# Patient Record
Sex: Female | Born: 1965 | Race: White | Hispanic: No | Marital: Single | Smoking: Never smoker
Health system: Southern US, Community
[De-identification: ages and names within clinical notes are randomized; demographics above are authoritative.]

## PROBLEM LIST (undated history)

## (undated) DIAGNOSIS — E559 Vitamin D deficiency, unspecified: Secondary | ICD-10-CM

## (undated) DIAGNOSIS — J40 Bronchitis, not specified as acute or chronic: Secondary | ICD-10-CM

## (undated) DIAGNOSIS — F32A Depression, unspecified: Secondary | ICD-10-CM

## (undated) DIAGNOSIS — R41840 Attention and concentration deficit: Secondary | ICD-10-CM

## (undated) DIAGNOSIS — I10 Essential (primary) hypertension: Secondary | ICD-10-CM

## (undated) DIAGNOSIS — F419 Anxiety disorder, unspecified: Secondary | ICD-10-CM

## (undated) DIAGNOSIS — F329 Major depressive disorder, single episode, unspecified: Secondary | ICD-10-CM

## (undated) DIAGNOSIS — F3281 Premenstrual dysphoric disorder: Secondary | ICD-10-CM

## (undated) DIAGNOSIS — E669 Obesity, unspecified: Secondary | ICD-10-CM

## (undated) DIAGNOSIS — M21619 Bunion of unspecified foot: Secondary | ICD-10-CM

## (undated) HISTORY — DX: Essential (primary) hypertension: I10

## (undated) HISTORY — DX: Attention and concentration deficit: R41.840

## (undated) HISTORY — PX: OTHER SURGICAL HISTORY: SHX169

## (undated) HISTORY — DX: Vitamin D deficiency, unspecified: E55.9

## (undated) HISTORY — DX: Premenstrual dysphoric disorder: F32.81

## (undated) HISTORY — DX: Obesity, unspecified: E66.9

## (undated) HISTORY — DX: Bunion of unspecified foot: M21.619

## (undated) HISTORY — DX: Depression, unspecified: F32.A

## (undated) HISTORY — DX: Major depressive disorder, single episode, unspecified: F32.9

## (undated) HISTORY — DX: Anxiety disorder, unspecified: F41.9

## (undated) HISTORY — PX: TONSILLECTOMY AND ADENOIDECTOMY: SUR1326

## (undated) HISTORY — PX: BUNIONECTOMY: SHX129

## (undated) HISTORY — DX: Bronchitis, not specified as acute or chronic: J40

---

## 2002-09-27 HISTORY — PX: COLONOSCOPY: SHX174

## 2011-02-15 LAB — HM MAMMOGRAPHY

## 2012-12-06 DIAGNOSIS — E669 Obesity, unspecified: Secondary | ICD-10-CM | POA: Insufficient documentation

## 2012-12-06 DIAGNOSIS — R41840 Attention and concentration deficit: Secondary | ICD-10-CM | POA: Insufficient documentation

## 2012-12-06 DIAGNOSIS — F32A Depression, unspecified: Secondary | ICD-10-CM | POA: Insufficient documentation

## 2012-12-06 DIAGNOSIS — F329 Major depressive disorder, single episode, unspecified: Secondary | ICD-10-CM | POA: Insufficient documentation

## 2012-12-06 DIAGNOSIS — M21619 Bunion of unspecified foot: Secondary | ICD-10-CM | POA: Insufficient documentation

## 2012-12-06 DIAGNOSIS — E559 Vitamin D deficiency, unspecified: Secondary | ICD-10-CM | POA: Insufficient documentation

## 2012-12-12 ENCOUNTER — Ambulatory Visit (INDEPENDENT_AMBULATORY_CARE_PROVIDER_SITE_OTHER): Payer: Self-pay | Admitting: Family Medicine

## 2012-12-12 ENCOUNTER — Encounter: Payer: Self-pay | Admitting: Family Medicine

## 2012-12-12 VITALS — BP 119/88 | HR 75 | Resp 16 | Wt 221.0 lb

## 2012-12-12 DIAGNOSIS — N943 Premenstrual tension syndrome: Secondary | ICD-10-CM

## 2012-12-12 DIAGNOSIS — R41840 Attention and concentration deficit: Secondary | ICD-10-CM

## 2012-12-12 MED ORDER — VILAZODONE HCL 10 & 20 & 40 MG PO KIT: 10.0000 mg | PACK | Freq: Every day | ORAL | Status: DC

## 2012-12-12 MED ORDER — LISDEXAMFETAMINE DIMESYLATE 70 MG PO CAPS: 70.0000 mg | ORAL_CAPSULE | Freq: Every day | ORAL | Status: DC

## 2012-12-12 NOTE — Progress Notes (Signed)
  Subjective:    Patient ID: Janet Glover, female    DOB: Nov 07, 1965, 47 y.o.   MRN: [id number]  HPI:  Janet Glover is here to get a refill for her Vyvanse.  She also wants to discuss her mood/PMS. She has been taking Prometrium for the last 1/2 of each month 2 weeks prior to her period.  She feels that it has helped but she thinks that she needs additional help.  She says that people get on her nerves. She tried the Belviq for a couple of weeks.  We thought we'd try it vs Prozac vs Viibryd.  She says that the Belviq worked well for her mood and appetite suppression but then it stopped working.      Review of Systems  Cardiovascular: Negative for palpitations.  Psychiatric/Behavioral: Positive for decreased concentration.       She gets annoyed with others easily and feels that this is worse around her period.         Objective:   Physical Exam  Constitutional: Vital signs are normal.  Cardiovascular: Normal rate and regular rhythm.   Pulmonary/Chest: Effort normal.  Psychiatric: She has a normal mood and affect. Her speech is normal and behavior is normal. Judgment and thought content normal. Cognition and memory are normal.          Assessment & Plan:   1)  ADD:  She will continue on her current dosage of Vyvanse.  She is not going to go several days without it like she has been doing so.  She is to take at least 1/3 of the dosage daily so she does not have a big drop in her dosage.    2)  PMS:  She is going to try some 5 HTP first to see how this works for her.  If she needs additional help, she will add 10 - 20 mg of the Viibryd.

## 2012-12-12 NOTE — Patient Instructions (Addendum)
1)  Mood:  Start on the highest dosage of OTC 5HTP you can find.  If you need additional help with your mood then add the Viibryd 10 mg and increase to 20 mg if needed.    2)  Concentration:  Try to take at least 1/3 of your Vyvanse dosage daily.  3)  Weight:  Start back on [employer] and get back to the gym.    Premenstrual Syndrome Premenstrual syndrome (PMS) or premenstrual disphoric disorder (PMDD) is a mix of emotional and clinical symptoms. PMS occurs 10 to 14 days before the start of a menstrual period. Common symptoms include pelvic pain, headache and mood changes. Most women have PMS to some degree.  CAUSES  The cause is unknown. There is evidence that it is related to the female hormones during the second half of the menstrual cycle. These hormones fluctuate and are thought to affect chemicals in the brain (serotonin) that can influence a person's mood. SYMPTOMS  Symptoms may include any of the following:  Headache.  Swelling of hands and feet.  Abdominal bloating.  Tiredness.  Breast tenderness.  Depression.  Crying spells.  Anxiety.  Irritability.  Confusion.  Joint and muscle pains.  Forgetfulness.  Withdrawal from family, friends and activities. DIAGNOSIS  Diagnosis is made by your caregiver who will ask you questions about the kind of symptoms you are having, when they occur and what may bring them on. If your are having any of the symptoms listed above that occur 10 to 14 days before your menstrual period, it is strong evidence you have PMS. TREATMENT   Only take over-the-counter or prescription medicines for pain, discomfort or fever as directed by your caregiver.  Oral contraceptives.  Hormone therapy.  Medications that slow down the production of serotonin in the brain (fluoxetine, sertraline and others).  Diuretics. These get rid of extra fluid from your body.  Anti-depression medication when necessary.  Surgery to remove both ovaries.  This is a last resort and if no further pregnancies are wanted.  Consider counseling or joining a PMS therapy support group. HOME CARE INSTRUCTIONS   Exercise regularly as suggested by your caregiver. Exercise especially before your menstrual period.  Eat a regular, well-balanced diet rich in carbohydrates.  Restrict or eliminate caffeine, alcohol and tobacco consumption.  Be sure to get enough sleep. Practice relaxation techniques.  Drink 64 oz. fluids per day. This is 8 glasses, 8 oz. each. It is best to drink water.  Eliminate known stressors in your life.  Attend relationship or parenting counseling, if needed.  Take a multi-vitamin in the recommended daily dosages.  Calcium, magnesium, vitamin B6 and vitamin E are some times helpful for PMS symptoms.  Take medications as suggested by your caregiver. SEEK MEDICAL CARE IF:   You need medication for excessive swelling, depression, severe headaches, or because you cannot sleep.  You need help from your caregiver to help you decide if you need to have your ovaries removed because none of your treatment is helping you. Document Released: 09/10/2000 Document Revised: 12/06/2011 Document Reviewed: 01/31/2012 [facility] Patient Information 2013 [place], [place].

## 2012-12-13 ENCOUNTER — Encounter: Payer: Self-pay | Admitting: Family Medicine

## 2013-03-09 ENCOUNTER — Ambulatory Visit (INDEPENDENT_AMBULATORY_CARE_PROVIDER_SITE_OTHER): Payer: 59 | Admitting: Family Medicine

## 2013-03-09 ENCOUNTER — Encounter: Payer: Self-pay | Admitting: Family Medicine

## 2013-03-09 VITALS — BP 118/78 | HR 83 | Wt 226.0 lb

## 2013-03-09 DIAGNOSIS — R41840 Attention and concentration deficit: Secondary | ICD-10-CM

## 2013-03-09 DIAGNOSIS — I10 Essential (primary) hypertension: Secondary | ICD-10-CM

## 2013-03-09 DIAGNOSIS — N943 Premenstrual tension syndrome: Secondary | ICD-10-CM

## 2013-03-09 MED ORDER — LISDEXAMFETAMINE DIMESYLATE 70 MG PO CAPS: 70.0000 mg | ORAL_CAPSULE | Freq: Every day | ORAL | Status: DC

## 2013-03-09 MED ORDER — TRIAMTERENE-HCTZ 37.5-25 MG PO TABS: 1.0000 | ORAL_TABLET | Freq: Every day | ORAL | Status: DC

## 2013-03-09 MED ORDER — VILAZODONE HCL 40 MG PO TABS: 40.0000 mg | ORAL_TABLET | Freq: Every day | ORAL | Status: DC

## 2013-03-09 NOTE — Progress Notes (Signed)
  Subjective:    Patient ID: Janet Glover, female    DOB: August 08, 1966, 47 y.o.   MRN: [id number]  HPI  Janet Glover is here today to discuss a few issues:  1)  ADD:  She needs a refill on her Vyvanse. She is doing fine on her current dosage.  She is eating and sleeping fine.    2)  Mood:  Her mood has improved on her Viibryd.  She notices the biggest difference around her period.    She needs a refill on it.   3)  HTN:  She needs to have Maxzide refilled.     Review of Systems  Constitutional: Negative for activity change, fatigue and unexpected weight change.  HENT: Negative.   Eyes: Negative.   Respiratory: Negative for shortness of breath.   Cardiovascular: Negative for chest pain, palpitations and leg swelling.  Gastrointestinal: Negative for diarrhea and constipation.  Endocrine: Negative.   Genitourinary: Negative for difficulty urinating.  Musculoskeletal: Negative.   Skin: Negative.   Neurological: Negative.   Hematological: Negative for adenopathy. Does not bruise/bleed easily.  Psychiatric/Behavioral: Negative for sleep disturbance and dysphoric mood. The patient is not nervous/anxious.        Past Medical History  Diagnosis Date  . Depression   . Obesity   . Vitamin D deficiency   . Premenstrual tension syndrome   . Attention and concentration deficit   . Bunion   . Bronchitis    Family History  Problem Relation Age of Onset  . Hypertension Mother   . Hyperlipidemia Mother   . Depression Mother   . Hypertension Father   . Depression Sister   . Vascular Disease Other   . Colon cancer Other   . CVA Other   . Heart disease Maternal Grandmother   . 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 No distress.  HENT:  Head: Normocephalic.  Eyes: No scleral icterus.  Neck: No thyromegaly present.  Cardiovascular: Normal rate, regular rhythm and normal heart sounds.   Pulmonary/Chest: Effort normal and breath sounds normal.  Abdominal: There is no tenderness.  Musculoskeletal: She exhibits no edema and no tenderness.  Neurological: She is alert.  Skin: Skin is warm and dry.  Psychiatric: She has a normal mood and affect. Her behavior is normal. Judgment and thought content normal.          Assessment & Plan:

## 2013-03-09 NOTE — Patient Instructions (Addendum)
Diet Following Bariatric Surgery The bariatric diet is designed to provide fluids and nourishment while promoting weight loss after bariatric surgery. The diet is divided into 3 stages. The rate of progression varies based on individual food tolerance. DIET FOLLOWING BARIATRIC SURGERY The diet following surgery is divided into 3 stages to allow a gradual adjustment. It is very important to the success of your surgery to:  Progress to each stage slowly.  Eat at set times.  Chew food well and stop eating when you are full.  Not drink liquids 30 minutes before and after meals. If you feel tightness or pressure in your chest, that means you are full. Wait 30 minutes before you try to eat again. STAGE 1 BARIATRIC DIET - ABOUT 2 WEEKS IN DURATION   The diet begins the day of surgery. It will last about 1 to 2 weeks after surgery. Your surgeon may have individual guidelines for you about specific foods or the progression of your diet. Follow your surgeon's guidelines.  If clear liquids are well-tolerated without vomiting, your caregiver will add a 4 oz to 6 oz high protein, low-calorie liquid supplement. You could add this to your meal plan 3 times daily. You will need at least 60 g to 80 g of protein daily or as determined by your Registered Dietitian.  Guidelines for choosing a protein supplement include:  At least 15 g of protein per 8 oz serving.  Less than 20 g total carbohydrate per 8 oz serving.  Less than 5 g fat per 8 oz serving.  Avoid carbonated beverages, caffeine, alcohol, and concentrated sweets such as sugar, cakes, and cookies.  Right after surgery, you may only be able to eat 3 to 4 tsp per meal. Your maximum volume should not exceed  to  cup total. Do not eat or drink more than 1 oz or 2 tbs every 15 minutes.  Take a chewable multivitamin and mineral supplement.  Drink at least 48 oz of fluid daily, which includes your protein supplement. Food and beverages from the  list below are allowed at set times (for example at 8 AM, 12 noon, or 5 PM):  Decaffeinated coffee or tea.  100% fruit juice.  Diet or sugar-free drinks.  Broth.  Blenderized soup.  Skim milk or lactose-free milk.  Sugar-free gelatin dessert or frozen ice pops.  Mashed potatoes.  Yogurt (artificially sweetened).  Sugar-free pudding.  Blended low-fat cottage cheese.  Unsweetened applesauce, grits, or hot wheat cereal. Four to six ounces of a liquid protein supplement from the list below is recommended for snacks at 10 AM, 2 PM, and 8 PM.  STAGE 2 BARIATRIC DIET (SOFT DIET) - ABOUT 4 WEEKS IN DURATION  About 2 weeks after surgery, your caregiver will progress your diet to this stage. Foods may need to be blended to the consistency of applesauce. Choose low-fat foods (less than 5 g of fat per serving) and avoid concentrated sweets and sugar (less than 10 g of sugar per serving). Meals should not exceed  to  cup total. This stage will last about 4 weeks. It is recommended that you meet with your dietitian at this stage to begin preparation for the last stage. This stage consists of 3 meals a day with a liquid protein supplement between meals twice daily. Do not drink liquids with foods. You must wait 30 minutes for the stomach pouch to empty before drinking. Chew food well. The food must be almost liquified before swallowing. Soft foods from the   list below can now be slowly added to your diet:  Soft fruit (soft canned fruit in light syrup or natural juice, banana, melon, peaches, pears, or strawberries).  Cooked vegetables.  Toast or crackers (becomes soft after chewing 20 times).  Hot wheat cereal.  Fish.  Eggs (scrambled, soft-boiled). STAGE 3 BARIATRIC DIET (REGULAR DIET) - ABOUT 6 to 8 WEEKS AFTER SURGERY About 6 to 8 weeks after surgery, you will be advanced to food that is regular in texture. This diet should include all food groups. The diet will continue to promote  weight loss. Meals should not exceed  to 1 cup total. Your dietitian will be available to assist you in meal planning and additional behavioral strategies to make this final stage a long-term success. Slowly add foods of regular consistency and remember:  Eat only at your chosen meal times.  Minimize drinking with meals. You should drink 30 minutes before eating. Do not start drinking again for about 2 hours after eating.  Chew food well. Take small bites.  Think about the portion size of a healthy frozen meal. You will be able to eat most of this.  Make sure your meal is balanced with starch, protein, fruits, and vegetables.  When you feel full, stop eating. Document Released: 03/20/2003 Document Revised: 12/06/2011 Document Reviewed: 12/11/2010 [facility] Patient Information 2014 [place], [place].  Clean Diet Dalia Heading Diet

## 2013-04-08 DIAGNOSIS — I10 Essential (primary) hypertension: Secondary | ICD-10-CM | POA: Insufficient documentation

## 2013-04-08 NOTE — Assessment & Plan Note (Signed)
Refilled her Vyvanse 

## 2013-04-08 NOTE — Assessment & Plan Note (Signed)
Refilled her Maxzide.  

## 2013-04-08 NOTE — Assessment & Plan Note (Signed)
Refilled her Viibryd.

## 2013-06-15 ENCOUNTER — Ambulatory Visit (INDEPENDENT_AMBULATORY_CARE_PROVIDER_SITE_OTHER): Payer: 59 | Admitting: Family Medicine

## 2013-06-15 ENCOUNTER — Encounter: Payer: Self-pay | Admitting: Family Medicine

## 2013-06-15 VITALS — BP 117/81 | HR 77 | Resp 16 | Ht 65.0 in | Wt 214.0 lb

## 2013-06-15 DIAGNOSIS — F909 Attention-deficit hyperactivity disorder, unspecified type: Secondary | ICD-10-CM

## 2013-06-15 MED ORDER — LISDEXAMFETAMINE DIMESYLATE 70 MG PO CAPS: 70.0000 mg | ORAL_CAPSULE | Freq: Every day | ORAL | Status: DC

## 2013-06-15 NOTE — Progress Notes (Signed)
  Subjective:    Patient ID: Janet Glover, female    DOB: 04-Mar-1966, 47 y.o.   MRN: [id number]  HPI  Janet Glover is here today to get a refill on her ADD medication (Vyvanse 70 mg).  She says that the medication continues to help her concentrate at work.  She feels that this dosage is appropriate and would like to continue on it.     Review of Systems  Constitutional: Negative.   HENT: Negative.   Eyes: Negative.   Respiratory: Negative.   Cardiovascular: Negative.   Gastrointestinal: Negative.   Endocrine: Negative.   Genitourinary: Negative.   Musculoskeletal: Negative.   Skin: Negative.   Allergic/Immunologic: Negative.   Neurological: Negative.   Hematological: Negative.   Psychiatric/Behavioral: Negative.      Past Medical History  Diagnosis Date  . Depression   . Obesity   . Vitamin D deficiency   . Premenstrual tension syndrome   . Attention and concentration deficit   . Bunion   . Bronchitis      Family History  Problem Relation Age of Onset  . Hypertension Mother   . Hyperlipidemia Mother   . Depression Mother   . Hypertension Father   . Depression Sister   . Vascular Disease Other   . Colon cancer Other   . CVA Other   . Heart disease Maternal Grandmother   . 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 Constitutional: She is oriented to person, place, and time. She appears well-developed and well-nourished.  Cardiovascular: Normal rate and regular rhythm.   Pulmonary/Chest: Effort normal and breath sounds normal.   Neurological: She is alert and oriented to person, place, and time.  Skin: Skin is warm and dry.  Psychiatric: She has a normal mood and affect.      Assessment & Plan:   Janet Glover was seen today for adhd.  Diagnoses and associated orders for this visit:  Adult ADHD Comments: Refilled her Vyvanse for 3 months - lisdexamfetamine (VYVANSE) 70 MG capsule; Take 1 capsule (70 mg total) by mouth daily.

## 2013-09-01 DIAGNOSIS — F909 Attention-deficit hyperactivity disorder, unspecified type: Secondary | ICD-10-CM | POA: Insufficient documentation

## 2013-09-11 ENCOUNTER — Encounter (INDEPENDENT_AMBULATORY_CARE_PROVIDER_SITE_OTHER): Payer: Self-pay

## 2013-09-11 ENCOUNTER — Encounter: Payer: Self-pay | Admitting: Family Medicine

## 2013-09-11 ENCOUNTER — Ambulatory Visit (INDEPENDENT_AMBULATORY_CARE_PROVIDER_SITE_OTHER): Payer: 59 | Admitting: Family Medicine

## 2013-09-11 VITALS — BP 119/84 | HR 76 | Resp 16 | Ht 64.37 in | Wt 197.0 lb

## 2013-09-11 DIAGNOSIS — E669 Obesity, unspecified: Secondary | ICD-10-CM | POA: Insufficient documentation

## 2013-09-11 DIAGNOSIS — N943 Premenstrual tension syndrome: Secondary | ICD-10-CM

## 2013-09-11 DIAGNOSIS — M25569 Pain in unspecified knee: Secondary | ICD-10-CM

## 2013-09-11 DIAGNOSIS — F909 Attention-deficit hyperactivity disorder, unspecified type: Secondary | ICD-10-CM

## 2013-09-11 DIAGNOSIS — F3281 Premenstrual dysphoric disorder: Secondary | ICD-10-CM

## 2013-09-11 MED ORDER — LISDEXAMFETAMINE DIMESYLATE 70 MG PO CAPS: 70.0000 mg | ORAL_CAPSULE | Freq: Every day | ORAL | Status: DC

## 2013-09-11 MED ORDER — PROGESTERONE MICRONIZED 200 MG PO CAPS: 200.0000 mg | ORAL_CAPSULE | Freq: Every day | ORAL | Status: DC

## 2013-09-11 MED ORDER — DICLOFENAC SODIUM 1 % TD GEL: 4.0000 g | Freq: Four times a day (QID) | TRANSDERMAL | Status: DC

## 2013-09-11 MED ORDER — VILAZODONE HCL 40 MG PO TABS: 40.0000 mg | ORAL_TABLET | Freq: Every day | ORAL | Status: DC

## 2013-09-11 NOTE — Progress Notes (Signed)
Subjective:    Patient ID: Janet Glover, female    DOB: 1966/09/27, 47 y.o.   MRN: [id number]  HPI  [name] is here today to get a refill on her ADD medication (Vyvanse 70 mg).  She says that the medication continues to help her concentrate at work.  She feels that this dosage is appropriate and would like to continue on it.  She also needs her FMLA forms completed.     Review of Systems  Constitutional: Negative for unexpected weight change.       She continues working out and dieting.   Musculoskeletal:       Pain in lower leg  Psychiatric/Behavioral: Negative for sleep disturbance and decreased concentration.  All other systems reviewed and are negative.     Past Medical History  Diagnosis Date  . Depression   . Obesity   . Vitamin D deficiency   . Attention and concentration deficit   . Bunion   . Bronchitis   . Anxiety   . PMDD (premenstrual dysphoric disorder)      Past Surgical History  Procedure Laterality Date  . Tonsillectomy and adenoidectomy    . 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 Hypertension Mother   . Hyperlipidemia Mother   . Depression Mother   . Hypertension Father   . Depression Sister   . Vascular Disease Other   . Colon cancer Other   . CVA Other   . Heart disease Maternal Grandmother   . Cancer Maternal Grandmother     colon cancer     Current Outpatient Prescriptions on File Prior to Visit  Medication Sig Dispense Refill  . triamterene-hydrochlorothiazide (MAXZIDE-25) 37.5-25 MG per tablet Take 1 tablet by mouth daily.  90  tablet  3   No current facility-administered medications on file prior to visit.     Allergies  Allergen Reactions  . Ace Inhibitors      Immunization History  Administered Date(s) Administered  . Td 09/27/2002  . Tdap 12/14/2006        Objective:   Physical Exam  Constitutional: She appears well-nourished. No distress.  Cardiovascular: Normal rate, regular rhythm and normal heart sounds.   Pulmonary/Chest: Effort normal and breath sounds normal.  Neurological: She is alert.  Psychiatric: She has a normal mood and affect. Her behavior is normal. Judgment and thought content normal.      Assessment & Plan:    Ravenne was seen today for medication management.  Diagnoses and associated orders for this visit:  Pain in joint, lower leg, unspecified laterality - diclofenac sodium (VOLTAREN) 1 % GEL; Apply 4 g topically 4 (four) times daily.  Adult ADHD Comments: Refilled her Vyvanse for 3 months - lisdexamfetamine (VYVANSE) 70 MG capsule; Take 1 capsule (70 mg total) by mouth daily.  PMDD (premenstrual dysphoric disorder) - Vilazodone HCl (VIIBRYD) 40 MG TABS; Take 1  tablet (40 mg total) by mouth daily. - progesterone (PROMETRIUM) 200 MG capsule; Take 1 capsule (200 mg total) by mouth at bedtime.    TIME SPENT "FACE TO FACE" WITH PATIENT -  30 MINS

## 2013-12-06 ENCOUNTER — Encounter (INDEPENDENT_AMBULATORY_CARE_PROVIDER_SITE_OTHER): Payer: Self-pay

## 2013-12-06 ENCOUNTER — Encounter: Payer: Self-pay | Admitting: Family Medicine

## 2013-12-06 ENCOUNTER — Ambulatory Visit (INDEPENDENT_AMBULATORY_CARE_PROVIDER_SITE_OTHER): Payer: 59 | Admitting: Family Medicine

## 2013-12-06 VITALS — BP 121/78 | HR 80 | Resp 16 | Ht 65.0 in | Wt 186.2 lb

## 2013-12-06 DIAGNOSIS — E669 Obesity, unspecified: Secondary | ICD-10-CM

## 2013-12-06 DIAGNOSIS — F909 Attention-deficit hyperactivity disorder, unspecified type: Secondary | ICD-10-CM

## 2013-12-06 MED ORDER — LISDEXAMFETAMINE DIMESYLATE 70 MG PO CAPS: 70.0000 mg | ORAL_CAPSULE | Freq: Every day | ORAL | Status: DC

## 2013-12-06 NOTE — Patient Instructions (Signed)
1)  ADD - Continue on Vyvanse.     2)  Weight - Congratulations!!  You have done great. Keep up the great work!!   3)  GI Symptoms - Lemon Ginger Tea with Probiotics      Gastrointestinal Problems During Exercise  Stomach problems that occur during exercise are common among athletes. This is especially true for distance runners and triathletes.  SYMPTOMS   Heartburn.  Feeling sick to stomach (nausea).  Vomiting.  Diarrhea.  Bloating.  Stomach pain.  Rectal bleeding and gassiness (flatulence).  Cramping.  Urge to go the bathroom (defecate). TREATMENT OF UPPER GI PROBLEMS The treatment of problems (symptoms) from the upper GI (gastrointestinal) tract caused by exercise are helped by:  Avoiding solid foods for 3 to 4 hours before intense exercise.  Eating a diet high in carbohydrates and very low in protein and fat.  Medications from your caregiver are also available to help upper GI problems. Some of these include antacids to neutralize the acids in your stomach. GASTROINTESTINAL BLEEDING Bleeding from the GI tract may occur with intense exercise. It is especially common in marathon runners. The bleeding is thought to come mostly from the stomach. It may also come from the large bowel (colon). You should not assume the bleeding is caused by exercise. If you have any problems with gastrointestinal bleeding, it is extremely important to see your caregiver for evaluation to make sure there are not other causes of bleeding. TREATMENT OF BLEEDING  Once it is known that exercise is the cause of blood loss, maintaining adequate fluid intake to avoid dehydration may be helpful.  Wear good running shoes to cut down on jarring the body during workouts.  It is good to have your blood (hemoglobin) and the iron stores of your body checked periodically, to see if iron replacement may be necessary.  Take medications prescribed by your caregiver as directed. Abdominal pain in athletes  is common during intense activities. The cause is unknown. It may be due to spasms in the muscular division between the abdomen and chest (the diaphragm), or by air trapped in parts of the large bowel. Abdominal pain may be helped by avoiding large meals prior to exercise.  Runner's diarrhea is a very common complaint among runners. The diarrhea seems to be related to the intensity of the exercise. It is more common during competition than during regular exercise. There are many different possible causes of this and it may be a combination of all of them. Treatment of diarrhea may be difficult. A reduction in fiber intake a day and a half before competition may be helpful. If the problem persists, antidiarrheal medicine may be used.  GENERALIZATIONS ABOUT TREATING ABDOMINAL EXERCISE PROBLEMS  Limit dietary fiber prior to competition.  Avoid solid foods at least three hours prior to a race.  Eat a pre-meal diet which is high in carbohydrates.  Avoid fat and protein intake during endurance events.  Drink fluids frequently during competition taking in two to three, 8 ounce glasses of fluid per hour. These should replace sodium, potassium, and carbohydrate in dilute solutions. Concentrated drinks may cause GI problems and aggravate existing conditions. Document Released: 06/08/2001 Document Revised: 12/06/2011 Document Reviewed: 09/13/2005 [facility] Patient Information 2014 [place].

## 2013-12-06 NOTE — Assessment & Plan Note (Signed)
Refilled her Vyvanse for 3 months.

## 2013-12-06 NOTE — Progress Notes (Signed)
Subjective:    Patient ID: Janet Glover, female    DOB: 1966/07/08, 47 y.o.   MRN: [id number]  HPI  Janet Glover is here today to get a refill on her ADD medication (Vyvanse 70 mg).  She says that the medication continues to help her concentrate at work.  She feels that this dosage is appropriate and would like to continue on it.     Review of Systems  Constitutional: Negative for appetite change and unexpected weight change.  Psychiatric/Behavioral: Negative for sleep disturbance and decreased concentration.  All other systems reviewed and are negative.     Past Medical History  Diagnosis Date  . Depression   . Obesity   . Vitamin D deficiency   . Attention and concentration deficit   . Bunion   . Bronchitis   . Anxiety   . PMDD (premenstrual dysphoric disorder)      Past Surgical History  Procedure Laterality Date  . Tonsillectomy and adenoidectomy    . 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 Hypertension Mother   . Hyperlipidemia Mother   . Depression Mother   . Hypertension Father   . Depression Sister   . Vascular Disease Other   . Colon cancer Other   . CVA Other   . Heart disease Maternal Grandmother   . Cancer Maternal Grandmother     colon cancer     Current Outpatient Prescriptions on File Prior to Visit  Medication Sig Dispense Refill  . Vilazodone HCl (VIIBRYD) 40 MG TABS Take 1 tablet (40 mg total) by mouth daily.  30 tablet  1  . triamterene-hydrochlorothiazide (MAXZIDE-25) 37.5-25 MG per tablet Take 1 tablet by mouth daily.  90 tablet  3    No current facility-administered medications on file prior to visit.     Allergies  Allergen Reactions  . Ace Inhibitors      Immunization History  Administered Date(s) Administered  . Td 09/27/2002  . Tdap 12/14/2006      Objective:   Physical Exam  Constitutional: She appears well-nourished. No distress.  Cardiovascular: Normal rate, regular rhythm and normal heart sounds.   Pulmonary/Chest: Effort normal and breath sounds normal.  Neurological: She is alert.  Psychiatric: She has a normal mood and affect. Her behavior is normal. Judgment and thought content normal.      Assessment & Plan:    Janet Glover was seen today for medication refill.  Diagnoses and associated orders for this visit:  Adult ADHD Comments: Refilled her Vyvanse for 3 months - lisdexamfetamine (VYVANSE) 70 MG capsule; Take 1 capsule (70 mg total) by mouth daily.  Obesity, unspecified Comments: Janet Glover has done great with her weight loss.  She has been using her Fitbit and My Fitness Pal and exercises 1-2 hours every day.  She has lost about 50 lbs since August.  Her goal is to get  down to around 160 lbs.

## 2014-03-18 ENCOUNTER — Encounter: Payer: Self-pay | Admitting: Family Medicine

## 2014-03-18 ENCOUNTER — Ambulatory Visit (INDEPENDENT_AMBULATORY_CARE_PROVIDER_SITE_OTHER): Payer: 59 | Admitting: Family Medicine

## 2014-03-18 VITALS — BP 130/88 | HR 83 | Resp 16 | Ht 64.0 in | Wt 169.0 lb

## 2014-03-18 DIAGNOSIS — F3281 Premenstrual dysphoric disorder: Secondary | ICD-10-CM | POA: Insufficient documentation

## 2014-03-18 DIAGNOSIS — F909 Attention-deficit hyperactivity disorder, unspecified type: Secondary | ICD-10-CM

## 2014-03-18 DIAGNOSIS — I10 Essential (primary) hypertension: Secondary | ICD-10-CM

## 2014-03-18 DIAGNOSIS — N943 Premenstrual tension syndrome: Secondary | ICD-10-CM

## 2014-03-18 MED ORDER — LISDEXAMFETAMINE DIMESYLATE 70 MG PO CAPS: 70.0000 mg | ORAL_CAPSULE | Freq: Every day | ORAL | Status: DC

## 2014-03-18 MED ORDER — TRIAMTERENE-HCTZ 37.5-25 MG PO TABS: 1.0000 | ORAL_TABLET | Freq: Every day | ORAL | Status: DC

## 2014-03-18 MED ORDER — VILAZODONE HCL 40 MG PO TABS: 40.0000 mg | ORAL_TABLET | Freq: Every day | ORAL | Status: DC

## 2014-03-18 NOTE — Patient Instructions (Signed)
1)  Mood/ADD - Continue on current medications.   2)  BP - Check your BP outside of our office and cut in 1/2 if your pressure drops and you feel lightheaded.  The combination of foods on the DASH diet will help lower your BP.     DASH Eating Plan DASH stands for "Dietary Approaches to Stop Hypertension." The DASH eating plan is a healthy eating plan that has been shown to reduce high blood pressure (hypertension). Additional health benefits may include reducing the risk of type 2 diabetes mellitus, heart disease, and stroke. The DASH eating plan may also help with weight loss. WHAT DO I NEED TO KNOW ABOUT THE DASH EATING PLAN? For the DASH eating plan, you will follow these general guidelines:  Choose foods with a percent daily value for sodium of less than 5% (as listed on the food label).  Use salt-free seasonings or herbs instead of table salt or sea salt.  Check with your health care Nakeem Murnane or pharmacist before using salt substitutes.  Eat lower-sodium products, often labeled as "lower sodium" or "no salt added."  Eat fresh foods.  Eat more vegetables, fruits, and low-fat dairy products.  Choose whole grains. Look for the word "whole" as the first word in the ingredient list.  Choose fish and skinless chicken or [place] more often than red meat. Limit fish, poultry, and meat to 6 oz (170 g) each day.  Limit sweets, desserts, sugars, and sugary drinks.  Choose heart-healthy fats.  Limit cheese to 1 oz (28 g) per day.  Eat more home-cooked food and less restaurant, buffet, and fast food.  Limit fried foods.  Cook foods using methods other than frying.  Limit canned vegetables. If you do use them, rinse them well to decrease the sodium.  When eating at a restaurant, ask that your food be prepared with less salt, or no salt if possible. WHAT FOODS CAN I EAT? Seek help from a dietitian for individual calorie needs. Grains Whole grain or whole wheat bread. Brown rice.  Whole grain or whole wheat pasta. Quinoa, bulgur, and whole grain cereals. Low-sodium cereals. Corn or whole wheat flour tortillas. Whole grain cornbread. Whole grain crackers. Low-sodium crackers. Vegetables Fresh or frozen vegetables (raw, steamed, roasted, or grilled). Low-sodium or reduced-sodium tomato and vegetable juices. Low-sodium or reduced-sodium tomato sauce and paste. Low-sodium or reduced-sodium canned vegetables.  Fruits All fresh, canned (in natural juice), or frozen fruits. Meat and Other Protein Products Ground beef (85% or leaner), grass-fed beef, or beef trimmed of fat. Skinless chicken or [place]. Ground chicken or [place]. Pork trimmed of fat. All fish and seafood. Eggs. Dried beans, peas, or lentils. Unsalted nuts and seeds. Unsalted canned beans. Dairy Low-fat dairy products, such as skim or 1% milk, 2% or reduced-fat cheeses, low-fat ricotta or cottage cheese, or plain low-fat yogurt. Low-sodium or reduced-sodium cheeses. Fats and Oils Tub margarines without trans fats. Light or reduced-fat mayonnaise and salad dressings (reduced sodium). Avocado. Safflower, olive, or canola oils. Natural peanut or almond butter. Other Unsalted popcorn and pretzels. The items listed above may not be a complete list of recommended foods or beverages. Contact your dietitian for more options. WHAT FOODS ARE NOT RECOMMENDED? Grains White bread. White pasta. White rice. Refined cornbread. Bagels and croissants. Crackers that contain trans fat. Vegetables Creamed or fried vegetables. Vegetables in a cheese sauce. Regular canned vegetables. Regular canned tomato sauce and paste. Regular tomato and vegetable juices. Fruits Dried fruits. Canned fruit in light or heavy  syrup. Fruit juice. Meat and Other Protein Products Fatty cuts of meat. Ribs, chicken wings, bacon, sausage, bologna, salami, chitterlings, fatback, hot dogs, bratwurst, and packaged luncheon meats. Salted nuts and seeds. Canned  beans with salt. Dairy Whole or 2% milk, cream, half-and-half, and cream cheese. Whole-fat or sweetened yogurt. Full-fat cheeses or blue cheese. Nondairy creamers and whipped toppings. Processed cheese, cheese spreads, or cheese curds. Condiments Onion and garlic salt, seasoned salt, table salt, and sea salt. Canned and packaged gravies. Worcestershire sauce. Tartar sauce. Barbecue sauce. Teriyaki sauce. Soy sauce, including reduced sodium. Steak sauce. Fish sauce. Oyster sauce. Cocktail sauce. Horseradish. Ketchup and mustard. Meat flavorings and tenderizers. Bouillon cubes. Hot sauce. Tabasco sauce. Marinades. Taco seasonings. Relishes. Fats and Oils Butter, stick margarine, lard, shortening, ghee, and bacon fat. Coconut, palm kernel, or palm oils. Regular salad dressings. Other Pickles and olives. Salted popcorn and pretzels. The items listed above may not be a complete list of foods and beverages to avoid. Contact your dietitian for more information. WHERE CAN I FIND MORE INFORMATION? National Heart, Lung, and Blood Institute: travelstabloid.com Document Released: 09/02/2011 Document Revised: 09/18/2013 Document Reviewed: 07/18/2013 [facility] Patient Information 2015 [place], [place]. This information is not intended to replace advice given to you by your health care Ervan Heber. Make sure you discuss any questions you have with your health care Rionna Feltes.

## 2014-03-18 NOTE — Progress Notes (Signed)
Subjective:    Patient ID: Janet Glover, female    DOB: 01-Dec-1965, 48 y.o.   MRN: [id number]  HPI  Janet Glover is here today needing medication refills for the following conditions:  1)  ADD - She is doing well on the Vyvanse 70 mg.   2)  Obesity - She continues to work hard on her diet and exercise. She has lost another 17 pounds since her last visit bringing her total weight loss to around 100 lbs.    3)  Mood - She is needing a refill on her Viibryd.  She takes 10-20 mg per day.    4)  Blood Pressure - She is still taking Maxzide. She was hoping that she could come off of it.     Review of Systems  Constitutional: Negative for activity change, appetite change, fatigue and unexpected weight change (she is working hard to loose weight).  Cardiovascular: Negative for chest pain, palpitations and leg swelling.  Psychiatric/Behavioral: Negative for behavioral problems. The patient is not nervous/anxious.   All other systems reviewed and are negative.    Past Medical History  Diagnosis Date  . Depression   . Obesity   . Vitamin D deficiency   . Attention and concentration deficit   . Bunion   . Bronchitis   . Anxiety   . PMDD (premenstrual dysphoric disorder)      Past Surgical History  Procedure Laterality Date  . Tonsillectomy and adenoidectomy    . 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 Hypertension Mother   . Hyperlipidemia Mother   . Depression Mother   . Hypertension Father   . Depression Sister   . Vascular Disease Other   . Colon cancer Other   .  CVA Other   . Heart disease Maternal Grandmother   . Cancer Maternal Grandmother     colon cancer     Allergies  Allergen Reactions  . Ace Inhibitors      Immunization History  Administered Date(s) Administered  . Td 09/27/2002  . Tdap 12/14/2006       Objective:   Physical Exam  Constitutional: She appears well-nourished. No distress.  HENT:  Head: Normocephalic.  Eyes: No scleral icterus.  Neck: No thyromegaly present.  Cardiovascular: Normal rate, regular rhythm and normal heart sounds.   Pulmonary/Chest: Effort normal and breath sounds normal.  Abdominal: There is no tenderness.  Musculoskeletal: She exhibits no edema and no tenderness.  Neurological: She is alert.  Skin: Skin is warm and dry.  Psychiatric: She has a normal mood and affect. Her behavior is normal. Judgment and thought content normal.      Assessment & Plan:    Isadore was seen today for medication management.  Her conditions are controlled on current dosages of her medications so she will remain on them.    Diagnoses  and associated orders for this visit:  Adult ADHD Comments: Refilled her Vyvanse for 3 months - lisdexamfetamine (VYVANSE) 70 MG capsule; Take 1 capsule (70 mg total) by mouth daily.  Essential hypertension, benign - triamterene-hydrochlorothiazide (MAXZIDE-25) 37.5-25 MG per tablet; Take 1 tablet by mouth daily.  PMDD (premenstrual dysphoric disorder) - Vilazodone HCl (VIIBRYD) 40 MG TABS; Take 1 tablet (40 mg total) by mouth daily.   TIME SPENT "FACE TO FACE" WITH PATIENT - 30 MINS

## 2014-04-23 ENCOUNTER — Encounter: Payer: Self-pay | Admitting: Family Medicine

## 2014-04-23 ENCOUNTER — Ambulatory Visit (INDEPENDENT_AMBULATORY_CARE_PROVIDER_SITE_OTHER): Payer: 59 | Admitting: Family Medicine

## 2014-04-23 VITALS — BP 137/76 | HR 85 | Resp 16 | Ht 64.0 in | Wt 176.0 lb

## 2014-04-23 DIAGNOSIS — F909 Attention-deficit hyperactivity disorder, unspecified type: Secondary | ICD-10-CM

## 2014-04-23 DIAGNOSIS — N943 Premenstrual tension syndrome: Secondary | ICD-10-CM

## 2014-04-23 MED ORDER — LISDEXAMFETAMINE DIMESYLATE 70 MG PO CAPS: 70.0000 mg | ORAL_CAPSULE | Freq: Every day | ORAL | Status: DC

## 2014-04-23 MED ORDER — PROGESTERONE MICRONIZED 200 MG PO CAPS: 200.0000 mg | ORAL_CAPSULE | Freq: Every day | ORAL | Status: DC

## 2014-04-23 NOTE — Progress Notes (Signed)
Subjective:    Patient ID: Janet Glover, female    DOB: [birth year], 48 y.o.   MRN: [id number]  HPI  Janet Glover is here today to follow up on her medications. She is needing to get her Vyvanse refilled. She also wants to discuss her hormones and increased anxiety.     Review of Systems  Constitutional: Negative for activity change, appetite change and fatigue.  Cardiovascular: Negative for chest pain, palpitations and leg swelling.  Psychiatric/Behavioral: Negative for decreased concentration. The patient is nervous/anxious.   All other systems reviewed and are negative.    Past Medical History  Diagnosis Date  . Depression   . Obesity   . Vitamin D deficiency   . Attention and concentration deficit   . Bunion   . Bronchitis   . Anxiety   . PMDD (premenstrual dysphoric disorder)      Past Surgical History  Procedure Laterality Date  . Tonsillectomy and adenoidectomy    . 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 Hypertension Mother   . Hyperlipidemia Mother   . Depression Mother   . Hypertension Father   . Depression Sister   . Vascular Disease Other   . Colon cancer Other   . CVA Other   . Heart disease Maternal Grandmother   . Cancer Maternal Grandmother     colon cancer     Current Outpatient Prescriptions on File Prior to Visit  Medication Sig Dispense Refill  . triamterene-hydrochlorothiazide (MAXZIDE-25) 37.5-25 MG per tablet Take 1 tablet by mouth daily.  90 tablet  3  . Vilazodone HCl (VIIBRYD) 40 MG TABS Take 1 tablet (40 mg total) by mouth  daily.  30 tablet  2   No current facility-administered medications on file prior to visit.     Allergies  Allergen Reactions  . Ace Inhibitors      Immunization History  Administered Date(s) Administered  . Td 09/27/2002  . Tdap 12/14/2006       Objective:   Physical Exam  Vitals reviewed. Constitutional: She appears well-nourished. No distress.  HENT:  Head: Normocephalic.  Eyes: No scleral icterus.  Neck: No thyromegaly present.  Cardiovascular: Normal rate, regular rhythm and normal heart sounds.   Pulmonary/Chest: Effort normal and breath sounds normal.  Abdominal: There is no tenderness.  Musculoskeletal: She exhibits no edema and no tenderness.  Neurological: She is alert.  Skin: Skin is warm and dry.  Psychiatric: She has a normal mood and affect. Her behavior is normal. Judgment and thought content normal.      Assessment & Plan:    Kynisha was seen today for medication management.  Diagnoses and associated orders for this visit:  Adult ADHD Comments: Refilled her Vyvanse for 3 months - Discontinue: lisdexamfetamine (VYVANSE) 70 MG capsule; Take 1 capsule (70 mg total) by mouth daily.  PMS (premenstrual syndrome) -  Discontinue: progesterone (PROMETRIUM) 200 MG capsule; Take 1 capsule (200 mg total) by mouth daily.

## 2014-05-03 ENCOUNTER — Other Ambulatory Visit: Payer: Self-pay | Admitting: Family Medicine

## 2014-05-03 DIAGNOSIS — F909 Attention-deficit hyperactivity disorder, unspecified type: Secondary | ICD-10-CM

## 2014-05-03 DIAGNOSIS — N943 Premenstrual tension syndrome: Secondary | ICD-10-CM

## 2014-05-03 MED ORDER — LISDEXAMFETAMINE DIMESYLATE 70 MG PO CAPS: 70.0000 mg | ORAL_CAPSULE | Freq: Every day | ORAL | Status: DC

## 2014-05-03 MED ORDER — PROGESTERONE MICRONIZED 200 MG PO CAPS: 200.0000 mg | ORAL_CAPSULE | Freq: Every day | ORAL | Status: AC

## 2016-04-23 ENCOUNTER — Other Ambulatory Visit (HOSPITAL_BASED_OUTPATIENT_CLINIC_OR_DEPARTMENT_OTHER): Payer: Self-pay | Admitting: Family Medicine

## 2016-04-23 DIAGNOSIS — Z1231 Encounter for screening mammogram for malignant neoplasm of breast: Secondary | ICD-10-CM

## 2016-04-26 ENCOUNTER — Ambulatory Visit (HOSPITAL_BASED_OUTPATIENT_CLINIC_OR_DEPARTMENT_OTHER): Payer: Self-pay

## 2016-04-27 ENCOUNTER — Ambulatory Visit (HOSPITAL_BASED_OUTPATIENT_CLINIC_OR_DEPARTMENT_OTHER): Payer: Self-pay

## 2016-09-06 ENCOUNTER — Ambulatory Visit (HOSPITAL_BASED_OUTPATIENT_CLINIC_OR_DEPARTMENT_OTHER): Payer: Self-pay

## 2018-02-28 DIAGNOSIS — F988 Other specified behavioral and emotional disorders with onset usually occurring in childhood and adolescence: Secondary | ICD-10-CM | POA: Insufficient documentation

## 2018-08-15 ENCOUNTER — Other Ambulatory Visit (HOSPITAL_BASED_OUTPATIENT_CLINIC_OR_DEPARTMENT_OTHER): Payer: Self-pay | Admitting: Family Medicine

## 2018-08-15 DIAGNOSIS — Z1231 Encounter for screening mammogram for malignant neoplasm of breast: Secondary | ICD-10-CM

## 2018-08-17 ENCOUNTER — Encounter (HOSPITAL_BASED_OUTPATIENT_CLINIC_OR_DEPARTMENT_OTHER): Payer: Self-pay

## 2018-08-17 ENCOUNTER — Ambulatory Visit (HOSPITAL_BASED_OUTPATIENT_CLINIC_OR_DEPARTMENT_OTHER)
Admission: RE | Admit: 2018-08-17 | Discharge: 2018-08-17 | Disposition: A | Discharge status: Home / Self Care | Payer: BLUE CROSS/BLUE SHIELD | Source: Ambulatory Visit | Attending: Family Medicine | Admitting: Family Medicine

## 2018-08-17 ENCOUNTER — Encounter (INDEPENDENT_AMBULATORY_CARE_PROVIDER_SITE_OTHER): Payer: Self-pay

## 2018-08-17 DIAGNOSIS — Z1231 Encounter for screening mammogram for malignant neoplasm of breast: Secondary | ICD-10-CM | POA: Diagnosis present

## 2019-10-10 IMAGING — MG DIGITAL SCREENING BILATERAL MAMMOGRAM WITH TOMO AND CAD
8 series · 8 of 24 positions shown · non-contrast
Comparison: None.

CLINICAL DATA: Screening.

EXAM:
DIGITAL SCREENING BILATERAL MAMMOGRAM WITH TOMO AND CAD

[R CC synth-2D]
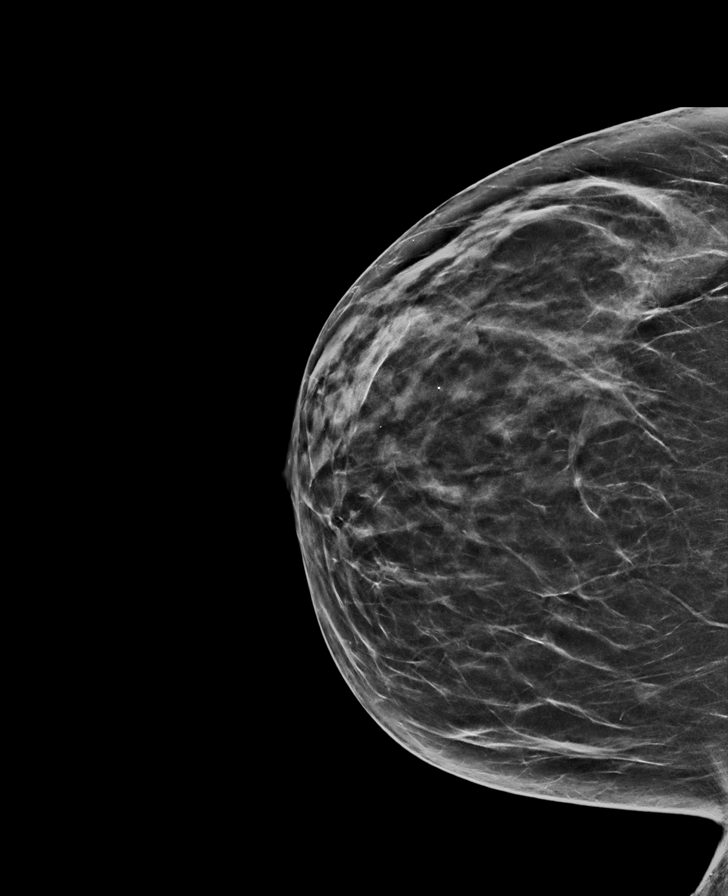

[L CC synth-2D]
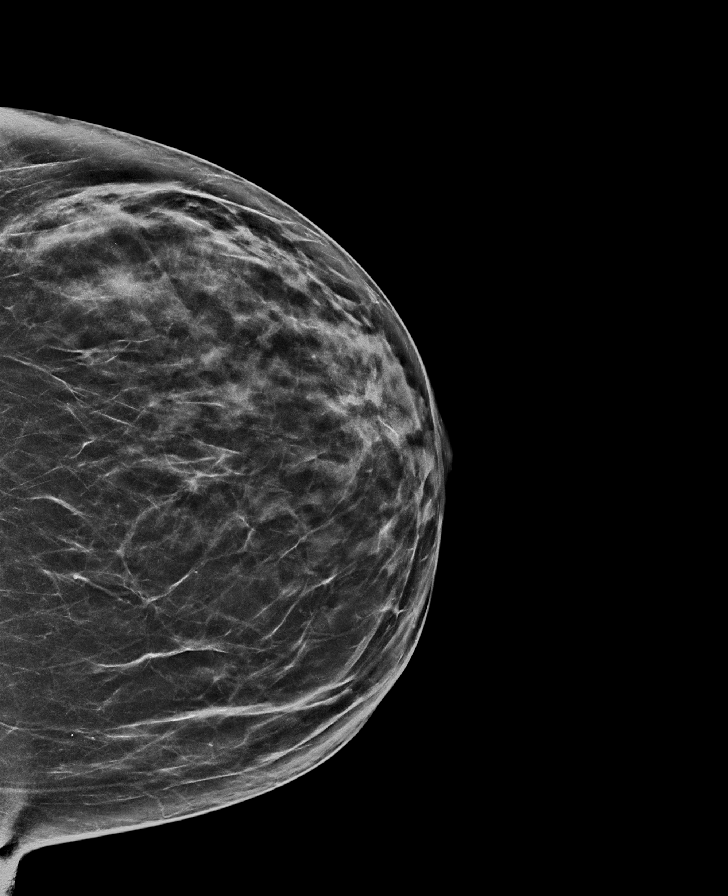

[R MLO synth-2D]
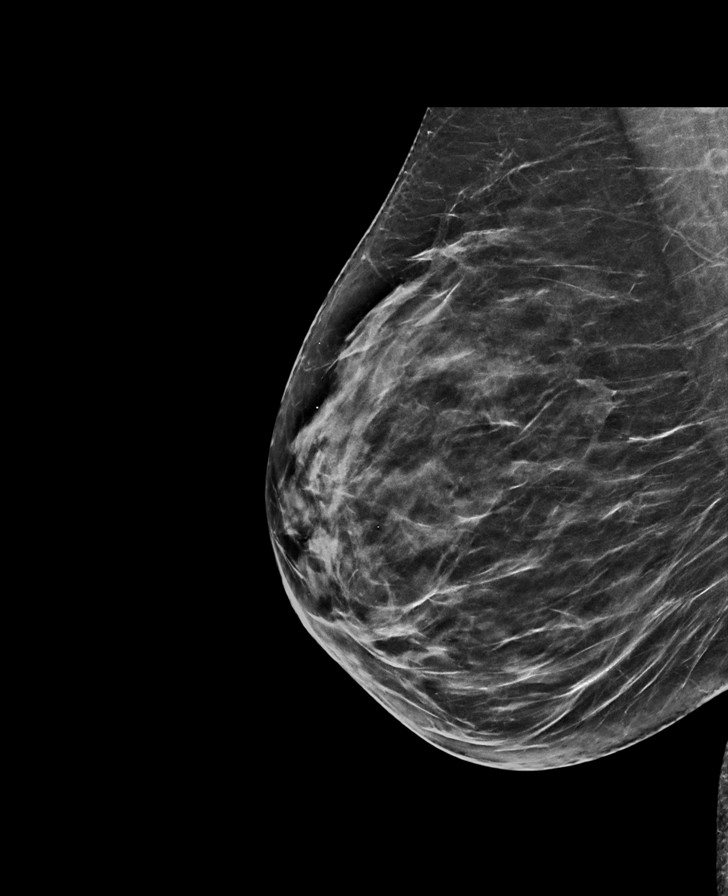

[L MLO synth-2D]
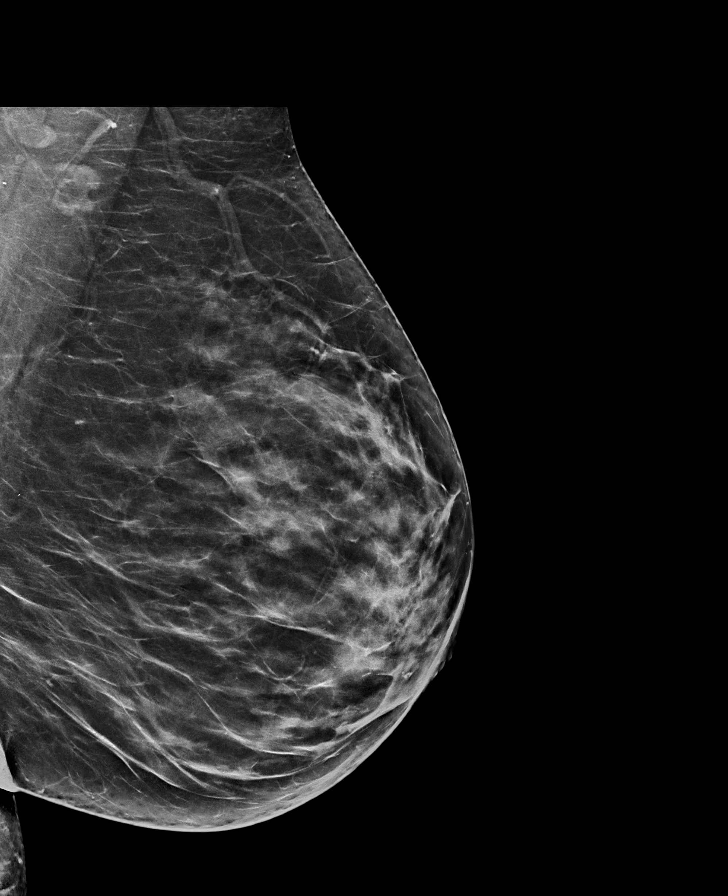

[R MLO tomo · tomo slice 36/71.0]
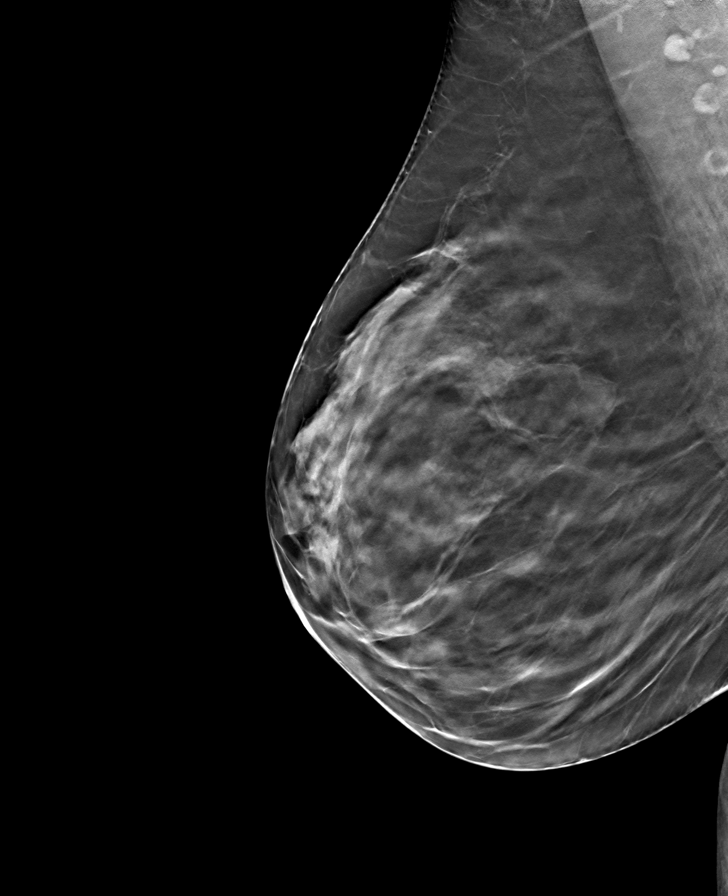

[L MLO tomo · tomo slice 37/73.0]
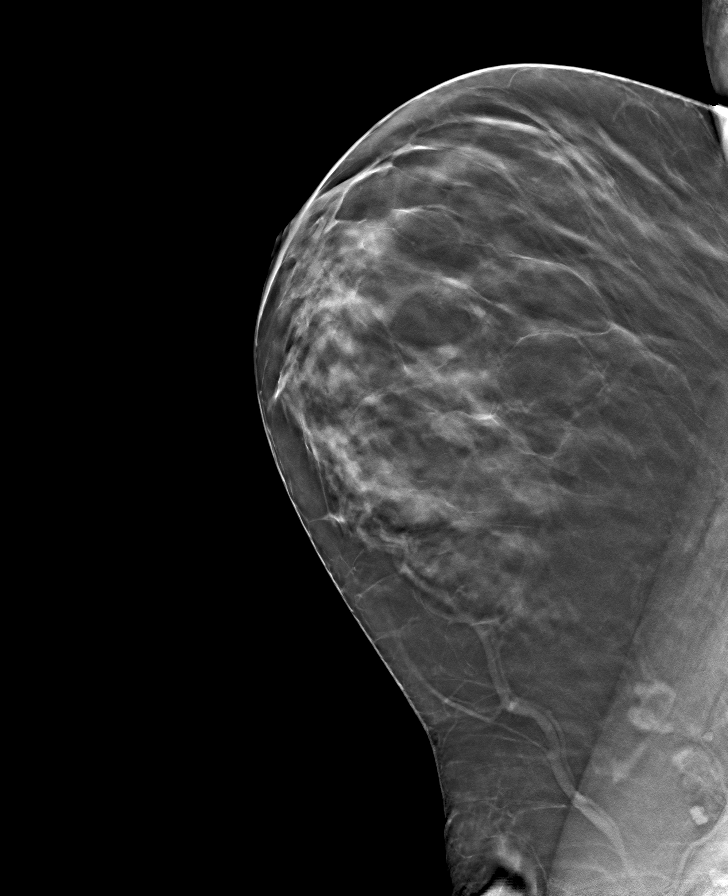

[L CC tomo · tomo slice 34/67.0]
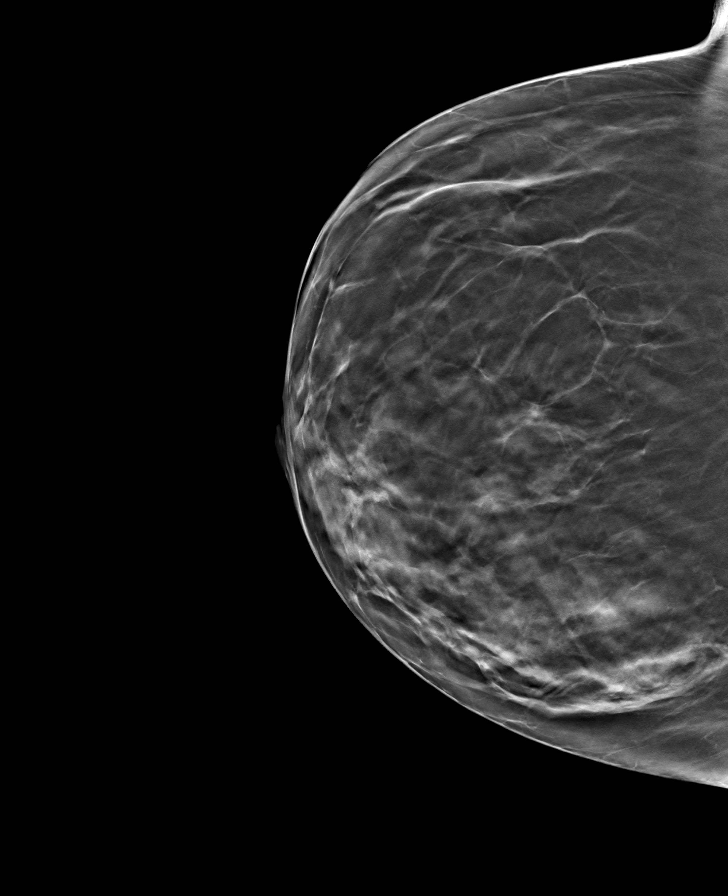

[R CC tomo · tomo slice 32/63.0]
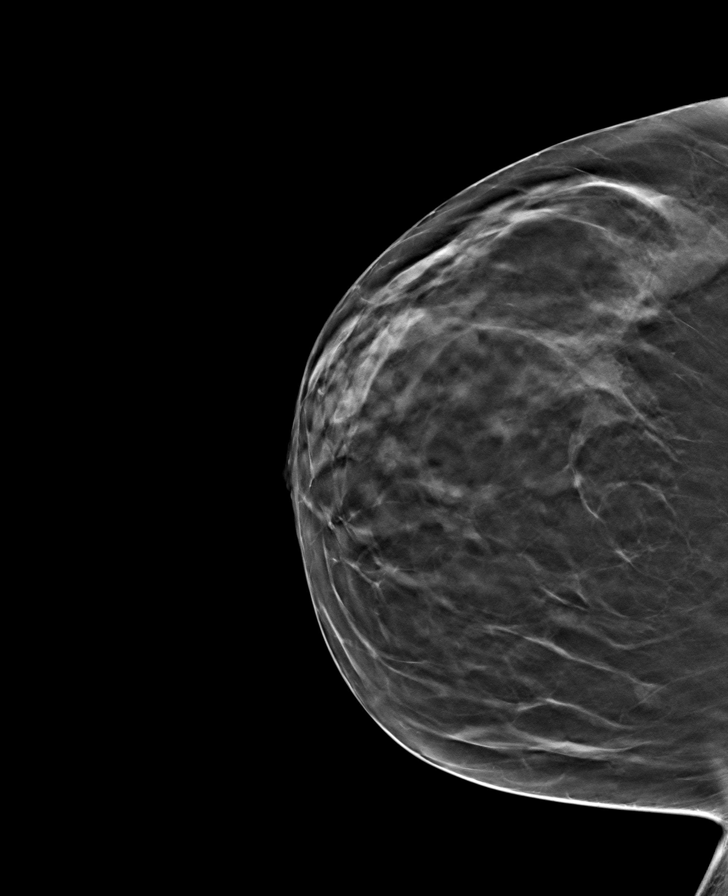

[8 of 24 positions shown; findings below may reference images not displayed]

ACR Breast Density Category b: There are scattered areas of
fibroglandular density.
FINDINGS: There are no findings suspicious for malignancy. Images were
processed with CAD.
IMPRESSION: No mammographic evidence of malignancy. A result letter of this
screening mammogram will be mailed directly to the patient.

RECOMMENDATION:
Screening mammogram in one year. (Code:Y5-G-EJ6)

BI-RADS CATEGORY  1: Negative.

## 2019-12-25 ENCOUNTER — Other Ambulatory Visit (HOSPITAL_BASED_OUTPATIENT_CLINIC_OR_DEPARTMENT_OTHER): Payer: Self-pay | Admitting: Family Medicine

## 2019-12-25 DIAGNOSIS — Z1231 Encounter for screening mammogram for malignant neoplasm of breast: Secondary | ICD-10-CM

## 2020-01-01 ENCOUNTER — Other Ambulatory Visit: Payer: Self-pay

## 2020-01-01 ENCOUNTER — Ambulatory Visit (HOSPITAL_BASED_OUTPATIENT_CLINIC_OR_DEPARTMENT_OTHER)
Admission: RE | Admit: 2020-01-01 | Discharge: 2020-01-01 | Disposition: A | Discharge status: Home / Self Care | Payer: BC Managed Care – PPO | Source: Ambulatory Visit | Attending: Family Medicine | Admitting: Family Medicine

## 2020-01-01 ENCOUNTER — Encounter (HOSPITAL_BASED_OUTPATIENT_CLINIC_OR_DEPARTMENT_OTHER): Payer: Self-pay

## 2020-01-01 DIAGNOSIS — Z1231 Encounter for screening mammogram for malignant neoplasm of breast: Secondary | ICD-10-CM | POA: Insufficient documentation

## 2020-01-02 ENCOUNTER — Other Ambulatory Visit: Payer: Self-pay | Admitting: Family Medicine

## 2020-01-02 DIAGNOSIS — R928 Other abnormal and inconclusive findings on diagnostic imaging of breast: Secondary | ICD-10-CM

## 2020-01-03 ENCOUNTER — Telehealth: Payer: Self-pay | Admitting: Radiology

## 2020-01-15 ENCOUNTER — Other Ambulatory Visit: Payer: Self-pay

## 2020-01-15 ENCOUNTER — Ambulatory Visit: Payer: BC Managed Care – PPO

## 2020-01-15 ENCOUNTER — Ambulatory Visit
Admission: RE | Admit: 2020-01-15 | Discharge: 2020-01-15 | Disposition: A | Discharge status: Home / Self Care | Payer: BC Managed Care – PPO | Source: Ambulatory Visit | Attending: Family Medicine | Admitting: Family Medicine

## 2020-01-15 DIAGNOSIS — R928 Other abnormal and inconclusive findings on diagnostic imaging of breast: Secondary | ICD-10-CM

## 2021-03-26 LAB — HM MAMMOGRAPHY

## 2021-11-05 ENCOUNTER — Encounter: Payer: Self-pay | Admitting: Family Medicine

## 2021-11-05 ENCOUNTER — Ambulatory Visit (INDEPENDENT_AMBULATORY_CARE_PROVIDER_SITE_OTHER): Payer: 59 | Admitting: Family Medicine

## 2021-11-05 VITALS — BP 134/79 | HR 113 | Ht 64.0 in | Wt 253.0 lb

## 2021-11-05 DIAGNOSIS — B079 Viral wart, unspecified: Secondary | ICD-10-CM

## 2021-11-05 DIAGNOSIS — L989 Disorder of the skin and subcutaneous tissue, unspecified: Secondary | ICD-10-CM

## 2021-11-05 DIAGNOSIS — Z6841 Body Mass Index (BMI) 40.0 and over, adult: Secondary | ICD-10-CM | POA: Diagnosis not present

## 2021-11-05 DIAGNOSIS — E66813 Obesity, class 3: Secondary | ICD-10-CM

## 2021-11-05 MED ORDER — WEGOVY 0.25 MG/0.5ML ~~LOC~~ SOAJ: 0.2500 mg | SUBCUTANEOUS | 0 refills | Status: DC

## 2021-11-05 NOTE — Patient Instructions (Signed)
Thank you for choosing [facility] for your Primary Care needs. I am excited for the opportunity to partner with you to meet your health care goals. It was a pleasure meeting you today! ° ° °Information on diet, exercise, and health maintenance recommendations are listed below. This is information to help you be sure you are on track for optimal health and monitoring.  ° °Please look over this and let us know if you have any questions or if you have completed any of the health maintenance outside of [place] so that we can be sure your records are up to date.  °___________________________________________________________ ° °MyChart:  °For all urgent or time sensitive needs we ask that you please call the office to avoid delays. Our number is [phone]. °MyChart is not constantly monitored and due to the large volume of messages a day, replies may take up to 72 business hours. ° °MyChart Policy: °MyChart allows for you to see your visit notes, after visit summary, provider recommendations, lab and tests results, make an appointment, request refills, and contact your provider or the office for non-urgent questions or concerns. Providers are seeing patients during normal business hours and do not have built in time to review MyChart messages.  °We ask that you allow a minimum of 3 business days for responses to MyChart messages. For this reason, please do not send urgent requests through MyChart. Please call the office at [phone]. °New and ongoing conditions may require a visit. We have virtual and in-person visits available for your convenience.  °Complex MyChart concerns may require a visit. Your provider may request you schedule a virtual or in-person visit to ensure we are providing the best care possible. °MyChart messages sent after 11:00 AM on Friday will not be received by the provider until Monday morning.  °  °Lab and Test Results: °You will receive your lab and  test results on MyChart as soon as they are completed and results have been sent by the lab or testing facility. Due to this service, you will receive your results BEFORE your provider.  °I review lab and test results each morning prior to seeing patients. Some results require collaboration with other providers to ensure you are receiving the most appropriate care. For this reason, we ask that you please allow a minimum of 3-5 business days from the time that ALL results have been received for your provider to receive and review lab and test results and contact you about these.  °Most lab and test result comments from the provider will be sent through MyChart. Your provider may recommend changes to the plan of care, follow-up visits, repeat testing, ask questions, or request an office visit to discuss these results. You may reply directly to this message or call the office to provide information for the provider or set up an appointment. °In some instances, you will be called with test results and recommendations. Please let us know if this is preferred and we will make note of this in your chart to provide this for you.    °If you have not heard a response to your lab or test results in 5 business days from all results returning to MyChart, please call the office to let us know. We ask that you please avoid calling prior to this time unless there is an emergent concern. Due to high call volumes, this can delay the resulting process. ° °After Hours: °For all non-emergency after hours needs,   please call the office at [phone] and select the option to reach the on-call  service. On-call services are shared between multiple [facility] offices and therefore it will not be possible to speak directly with your provider. On-call providers may provide medical advice and recommendations, but are unable to provide refills for maintenance medications.  °For all emergency or urgent medical needs after normal business  hours, we recommend that you seek care at the closest Urgent Care or Emergency Department to ensure appropriate treatment in a timely manner.  °[facility] has a 24 hour emergency room located on the ground floor for your convenience.  ° °Urgent Concerns During the Business Day °Providers are seeing patients from 8AM to 5PM with a busy schedule and are most often not able to respond to non-urgent calls until the end of the day or the next business day. °If you should have URGENT concerns during the day, please call and speak to the nurse or schedule a same day appointment so that we can address your concern without delay.  ° °Thank you, again, for choosing me as your health care partner. I appreciate your trust and look forward to learning more about you.  ° °Navia Lindahl B. Acheron Sugg, DNP, FNP-C ° °___________________________________________________________ ° °Health Maintenance Recommendations °Screening Testing °Mammogram °Every 1-2 years based on history and risk factors °Starting at age 50 °Pap Smear °Ages 21-39 every 3 years °Ages 30-65 every 5 years with HPV testing °More frequent testing may be required based on results and history °Colon Cancer Screening °Every 1-10 years based on test performed, risk factors, and history °Starting at age 45 °Bone Density Screening °Every 2-10 years based on history °Starting at age 65 for women °Recommendations for men differ based on medication usage, history, and risk factors °AAA Screening °One time ultrasound °Men 65-75 years old who have ever smoked °Lung Cancer Screening °Low Dose Lung CT every 12 months °Age 50-80 years with a 20 pack-year smoking history who still smoke or who have quit within the last 15 years ° °Screening Labs °Routine  Labs: Complete Blood Count (CBC), Complete Metabolic Panel (CMP), Cholesterol (Lipid Panel) °Every 6-12 months based on history and medications °May be recommended more frequently based on current conditions or  previous results °Hemoglobin A1c Lab °Every 3-12 months based on history and previous results °Starting at age 45 or earlier with diagnosis of diabetes, high cholesterol, BMI >26, and/or risk factors °Frequent monitoring for patients with diabetes to ensure blood sugar control °Thyroid Panel (TSH w/ T3 & T4) °Every 6 months based on history, symptoms, and risk factors °May be repeated more often if on medication °HIV °One time testing for all patients 13 and older °May be repeated more frequently for patients with increased risk factors or exposure °Hepatitis C °One time testing for all patients 18 and older °May be repeated more frequently for patients with increased risk factors or exposure °Gonorrhea, Chlamydia °Every 12 months for all sexually active persons 13-24 years °Additional monitoring may be recommended for those who are considered high risk or who have symptoms °PSA °Men 40-54 years old with risk factors °Additional screening may be recommended from age 55-69 based on risk factors, symptoms, and history ° °Vaccine Recommendations °Tetanus Booster °All adults every 10 years °Flu Vaccine °All patients 6 months and older every year °COVID Vaccine °All patients 12 years and older °Initial dosing with booster °May recommend additional booster based on age and health history °HPV Vaccine °2 doses all patients   age 9-26 °Dosing may be considered for patients over 26 °Shingles Vaccine (Shingrix) °2 doses all adults 50 years and older °Pneumonia (Pneumovax 23) °All adults 65 years and older °May recommend earlier dosing based on health history °Pneumonia (Prevnar 13) °All adults 65 years and older °Dosed 1 year after Pneumovax 23 °Pneumonia (Prevnar 20) °All adults 65 years and older (adults 19-64 with certain conditions or risk factors) °1 dose  °For those who have no received Prevnar 13 vaccine previously ° ° °Additional Screening, Testing, and Vaccinations may be recommended on an individualized basis based on  family history, health history, risk factors, and/or exposure.  °__________________________________________________________ ° °Diet Recommendations for All Patients ° °I recommend that all patients maintain a diet low in saturated fats, carbohydrates, and cholesterol. While this can be challenging at first, it is not impossible and small changes can make big differences.  °Things to try: °Decreasing the amount of soda, sweet tea, and/or juice to one or less per day and replace with water °While water is always the first choice, if you do not like water you may consider °adding a water additive without sugar to improve the taste °other sugar free drinks °Replace potatoes with a brightly colored vegetable at dinner °Use healthy oils, such as canola oil or olive oil, instead of butter or hard margarine °Limit your bread intake to two pieces or less a day °Replace regular pasta with low carb pasta options °Bake, broil, or grill foods instead of frying °Monitor portion sizes  °Eat smaller, more frequent meals throughout the day instead of large meals ° °An important thing to remember is, if you love foods that are not great for your health, you don't have to give them up completely. Instead, allow these foods to be a reward when you have done well. Allowing yourself to still have special treats every once in a while is a nice way to tell yourself thank you for working hard to keep yourself healthy.  ° °Also remember that every day is a new day. If you have a bad day and "fall off the wagon", you can still climb right back up and keep moving along on your journey! ° °We have resources available to help you!  °Some websites that may be helpful include: °www.MyPlate.gov  °Www.VeryWellFit.com °_____________________________________________________________ ° °Activity Recommendations for All Patients ° °I recommend that all adults get at least 20 minutes of moderate physical activity that elevates your heart rate at least 5  days out of the week.  °Some examples include: °Walking or jogging at a pace that allows you to carry on a conversation °Cycling (stationary bike or outdoors) °Water aerobics °Yoga °Weight lifting °Dancing °If physical limitations prevent you from putting stress on your joints, exercise in a pool or seated in a chair are excellent options. ° °Do determine your MAXIMUM heart rate for activity: YOUR AGE - 220 = MAX HeartRate  ° °Remember! °Do not push yourself too hard.  °Start slowly and build up your pace, speed, weight, time in exercise, etc.  °Allow your body to rest between exercise and get good sleep. °You will need more water than normal when you are exerting yourself. Do not wait until you are thirsty to drink. Drink with a purpose of getting in at least 8, 8 ounce glasses of water a day plus more depending on how much you exercise and sweat.  ° ° °If you begin to develop dizziness, chest pain, abdominal pain, jaw pain, shortness of breath, headache, vision   changes, lightheadedness, or other concerning symptoms, stop the activity and allow your body to rest. If your symptoms are severe, seek emergency evaluation immediately. If your symptoms are concerning, but not severe, please let us know so that we can recommend further evaluation.  ° ° ° °

## 2021-11-05 NOTE — Progress Notes (Signed)
______________________________________________________________________  HPI Janet Glover is a 56 y.o. female presenting to [facility] at [facility] today to establish care.   Patient Care Team: Terrilyn Saver, NP as PCP - General (Family Medicine)  Health Maintenance  Topic Date Due   COVID-19 Vaccine (1) Never done   HIV Screening  Never done   Hepatitis C Screening: USPSTF Recommendation to screen - Ages 48-79 yo.  Never done   Pap Smear  07/14/2013   Colon Cancer Screening  10/08/2013   Zoster (Shingles) Vaccine (2 of 2) 02/26/2021   Mammogram  03/27/2023   Tetanus Vaccine  02/25/2026   Flu Shot  Completed   HPV Vaccine  Aged Out     Concerns today: ADD - on Vyvanse (won't need refills for another 2 months). Has been on for years. Tried switching to adderall at one point and it didn't help; only takes it when she is at work, no side effects  Skin - age spot on face for the past 2 years, gets flaky/dry in the winter, in the summer it is much less noticeable, not getting any larger  Skin - area to right forearm, first noticed 8-10 months, no pain/itching/drainage/redness/edema, felt like a wart at first, but she has been picking at it. No surrounding edema, erythema, warmth, drainage. Weight management - tried wegovy one month (lost 20 pounds that month) then insurance didn't cover it anymore, now has new insurance. Would like to try it again. Tries to focus on healthy diet and portion control. Exercise 7 days a week, with no improvement, but somewhat limited due to pain/discomfort related to weight. Not a good candidate for phentermine since she is on Vyvanse.    Patient Active Problem List   Diagnosis Date Noted   PMDD (premenstrual dysphoric disorder) 03/18/2014   Obesity, unspecified 12/06/2013   Obesity (BMI 30-39.9) 09/11/2013   Adult ADHD 09/01/2013   Essential hypertension, benign 04/08/2013   Depression    Obesity    Vitamin D deficiency     Premenstrual tension syndrome    Attention and concentration deficit    Bunion     PHQ9 Today: No flowsheet data found. GAD7 Today: No flowsheet data found. ______________________________________________________________________ PMH Past Medical History:  Diagnosis Date   Anxiety    Attention and concentration deficit    Bronchitis    Bunion    Depression    Obesity    PMDD (premenstrual dysphoric disorder)    Vitamin D deficiency     ROS All review of systems negative except what is listed in the HPI  PHYSICAL EXAM Physical Exam Vitals reviewed.  Constitutional:      Appearance: Normal appearance. She is obese.  HENT:     Head: Normocephalic and atraumatic.  Cardiovascular:     Rate and Rhythm: Normal rate and regular rhythm.  Pulmonary:     Effort: Pulmonary effort is normal.     Breath sounds: Normal breath sounds.  Musculoskeletal:        General: Normal range of motion.  Skin:    Comments: Wart to right forearm, no signs of infection; see picture of right cheek - hyperpigmentation, dry, flaky, excoriated from scratching, no inflammation, surrounding erythema, warmth, drainage  Neurological:     General: No focal deficit present.     Mental Status: She is alert and oriented to person, place, and time.  Psychiatric:        Mood and Affect: Mood normal.  Behavior: Behavior normal.        Thought Content: Thought content normal.        Judgment: Judgment normal.   ______________________________________________________________________ ASSESSMENT AND PLAN  1. Class 3 severe obesity without serious comorbidity with body mass index (BMI) of 40.0 to 44.9 in adult, unspecified obesity type ([place]) Ordering Wegovy since she did so well on it in the past. Will update her if insurance approves. Continue healthy diet and regular exercise. Consider referral to [facility]  - WEGOVY 0.25 MG/0.5ML SOAJ; Inject 0.25 mg into the skin once a week. Use this  dose for 1 month (4 shots) and then increase to next higher dose.  Dispense: 2 mL; Refill: 0  2. Skin lesion of right arm Cryotherapy template Procedure: Cryodestruction of: R forearm wart Consent obtained and verified. Time-out conducted. Noted no overlying erythema, induration, or other signs of local infection. Completed without difficulty using Cryo-Gun. Advised to call if fevers/chills, erythema, induration, drainage, or persistent bleeding.   3. Skin lesion of face Unsure etiology. Can try low potency steroid and good moisturizers for brief trial. Dermatology referral placed. Recommend she try not to scratch it and monitor of signs of infection.  - Ambulatory referral to Dermatology   Establish care:  Education provided today during visit and on AVS for patient to review at home.  Diet and Exercise recommendations provided.  Current diagnoses and recommendations discussed. HM recommendations reviewed with recommendations.    Outpatient Encounter Medications as of 11/05/2021  Medication Sig   [DISCONTINUED] WEGOVY 0.25 MG/0.5ML SOAJ Inject 0.25 mg into the skin once a week. Use this dose for 1 month (4 shots) and then increase to next higher dose.   FLUoxetine (PROZAC) 10 MG capsule Take 10 mg by mouth daily.   lisdexamfetamine (VYVANSE) 70 MG capsule Take 1 capsule (70 mg total) by mouth daily.   losartan-hydrochlorothiazide (HYZAAR) 100-25 MG tablet Take 1 tablet by mouth every morning.   WEGOVY 0.25 MG/0.5ML SOAJ Inject 0.25 mg into the skin once a week. Use this dose for 1 month (4 shots) and then increase to next higher dose.   [DISCONTINUED] triamterene-hydrochlorothiazide (MAXZIDE-25) 37.5-25 MG per tablet Take 1 tablet by mouth daily.   [DISCONTINUED] Vilazodone HCl (VIIBRYD) 40 MG TABS Take 1 tablet (40 mg total) by mouth daily.   No facility-administered encounter medications on file as of 11/05/2021.    Return in about 2 months (around 01/03/2022) for physical, labs,  ADHD refills .    Purcell Nails Olevia Bowens, DNP, FNP-C

## 2021-11-09 ENCOUNTER — Other Ambulatory Visit: Payer: Self-pay | Admitting: Family Medicine

## 2021-11-09 DIAGNOSIS — Z6841 Body Mass Index (BMI) 40.0 and over, adult: Secondary | ICD-10-CM

## 2021-11-12 ENCOUNTER — Telehealth: Payer: Self-pay | Admitting: Family Medicine

## 2021-11-12 NOTE — Telephone Encounter (Signed)
Pt would like an update on the prior authorization for wegovy. She stated she is having trouble with her mychart and she will best be reached via cell phone. Please advise.

## 2021-11-13 NOTE — Telephone Encounter (Signed)
I have not received a PA request from her pharmacy.  I will reach out to them to inquire.

## 2021-11-13 NOTE — Telephone Encounter (Signed)
Patient called in regards to her PA for [facility]

## 2021-11-13 NOTE — Telephone Encounter (Signed)
Called pharmacy and was advised that rx was ran through pt's new ins and it went through without an PA. Unfortunately the out of pocket cost for the pt is over $800.  Pt notified.

## 2021-12-23 ENCOUNTER — Ambulatory Visit (INDEPENDENT_AMBULATORY_CARE_PROVIDER_SITE_OTHER): Payer: 59 | Admitting: Family Medicine

## 2021-12-23 ENCOUNTER — Other Ambulatory Visit (HOSPITAL_COMMUNITY)
Admission: RE | Admit: 2021-12-23 | Discharge: 2021-12-23 | Disposition: A | Discharge status: Home / Self Care | Payer: 59 | Source: Ambulatory Visit | Attending: Family Medicine | Admitting: Family Medicine

## 2021-12-23 ENCOUNTER — Encounter: Payer: Self-pay | Admitting: Family Medicine

## 2021-12-23 VITALS — BP 121/82 | HR 84 | Ht 64.0 in | Wt 254.0 lb

## 2021-12-23 DIAGNOSIS — R2242 Localized swelling, mass and lump, left lower limb: Secondary | ICD-10-CM | POA: Diagnosis not present

## 2021-12-23 DIAGNOSIS — Z124 Encounter for screening for malignant neoplasm of cervix: Secondary | ICD-10-CM | POA: Insufficient documentation

## 2021-12-23 DIAGNOSIS — Z23 Encounter for immunization: Secondary | ICD-10-CM | POA: Diagnosis not present

## 2021-12-23 DIAGNOSIS — N760 Acute vaginitis: Secondary | ICD-10-CM | POA: Insufficient documentation

## 2021-12-23 DIAGNOSIS — B9689 Other specified bacterial agents as the cause of diseases classified elsewhere: Secondary | ICD-10-CM | POA: Insufficient documentation

## 2021-12-23 DIAGNOSIS — Z1211 Encounter for screening for malignant neoplasm of colon: Secondary | ICD-10-CM | POA: Diagnosis not present

## 2021-12-23 DIAGNOSIS — I10 Essential (primary) hypertension: Secondary | ICD-10-CM

## 2021-12-23 DIAGNOSIS — R41840 Attention and concentration deficit: Secondary | ICD-10-CM

## 2021-12-23 DIAGNOSIS — Z0001 Encounter for general adult medical examination with abnormal findings: Secondary | ICD-10-CM | POA: Diagnosis not present

## 2021-12-23 DIAGNOSIS — R69 Illness, unspecified: Secondary | ICD-10-CM | POA: Diagnosis not present

## 2021-12-23 DIAGNOSIS — E559 Vitamin D deficiency, unspecified: Secondary | ICD-10-CM

## 2021-12-23 DIAGNOSIS — Z6841 Body Mass Index (BMI) 40.0 and over, adult: Secondary | ICD-10-CM | POA: Diagnosis not present

## 2021-12-23 DIAGNOSIS — F32A Depression, unspecified: Secondary | ICD-10-CM

## 2021-12-23 MED ORDER — LOSARTAN POTASSIUM-HCTZ 100-25 MG PO TABS: 1.0000 | ORAL_TABLET | Freq: Every morning | ORAL | 1 refills | Status: DC

## 2021-12-23 MED ORDER — LISDEXAMFETAMINE DIMESYLATE 70 MG PO CAPS: 70.0000 mg | ORAL_CAPSULE | Freq: Every day | ORAL | 0 refills | Status: DC

## 2021-12-23 MED ORDER — FLUOXETINE HCL 10 MG PO CAPS: 10.0000 mg | ORAL_CAPSULE | Freq: Every day | ORAL | 1 refills | Status: DC

## 2021-12-23 NOTE — Progress Notes (Signed)
? ?BP 121/82   Pulse 84   Ht '5\' 4"'$  (1.626 m)   Wt 254 lb (115.2 kg)   LMP 08/09/2018   BMI 43.60 kg/m?   ? ?Subjective:  ? ? Patient ID: Janet Glover, female    DOB: 02-Feb-1966, 56 y.o.   MRN: [id number] ? ?HPI: ?Dorianne Perret is a 56 y.o. female presenting on 12/23/2021 for comprehensive medical examination. Current medical complaints include: vaginal itching, no discharge, pain, bleeding, fevers, urinary changes; localized swelling to lower left (medial, anterior) thought it was a lipoma in the past, but seems to be getting bigger ? ?She currently lives with: husband ?Interim Problems from her last visit: no  ? ?She reports regular vision exams q1-5y: yes ?She reports regular dental exams q 66m yes ?Her diet consists of: regular ?She endorses exercise and/or activity of: gardening, yard work ?She works at: works from home for a [employer] ? ?She endorses ETOH use. - once a month  ?She denies nictoine use. ?She denies illegal substance use.  ? ? ?She reports no periods, menopause around [age] ?Current menopausal symptoms: no ?She is currently  sexually active with husband ?She denies  concerns today about STI ?Contraception choices are: n/a ? ?She denies concerns about skin changes today. ?She denies concerns about bowel changes today. ?She denies concerns about bladder changes today. ? ?Depression Screen done today and results listed below:  ? ?  12/23/2021  ?  2:50 PM  ?Depression screen PHQ 2/9  ?Decreased Interest 0  ?Down, Depressed, Hopeless 0  ?PHQ - 2 Score 0  ? ? ?She does not have a history of falls.  ? ? ? ? ? ?Past Medical History:  ?Past Medical History:  ?Diagnosis Date  ? Anxiety   ? Attention and concentration deficit   ? Bronchitis   ? Bunion   ? Depression   ? Obesity   ? PMDD (premenstrual dysphoric disorder)   ? Vitamin D deficiency   ? ? ?Surgical History:  ?Past Surgical History:  ?Procedure Laterality Date  ? BUNIONECTOMY    ? lipoma removal    ? TONSILLECTOMY AND ADENOIDECTOMY     ? ? ?Medications:  ?Current Outpatient Medications on File Prior to Visit  ?Medication Sig  ? lisdexamfetamine (VYVANSE) 70 MG capsule Take 1 capsule (70 mg total) by mouth daily.  ? ?No current facility-administered medications on file prior to visit.  ? ? ?Allergies:  ?Allergies  ?Allergen Reactions  ? Ace Inhibitors   ? ? ?Social History:  ?Social History  ? ?Socioeconomic History  ? Marital status: Single  ?  Spouse name: Not on file  ? Number of children: 0  ? Years of education: 122 ? Highest education level: Not on file  ?Occupational History  ? Occupation: CUSTOMER SERVICE REPRESENTATIVE  ?  Employer: [employer]  ?Tobacco Use  ? Smoking status: Never  ? Smokeless tobacco: Never  ?Substance and Sexual Activity  ? Alcohol use: Yes  ?  Comment: She has had very limited intake since she has been working on her diet.    ? Drug use: No  ? Sexual activity: Yes  ?  Partners: Male  ?Other Topics Concern  ? Not on file  ?Social History Narrative  ? Marital Status: Single   ? Children:  None   ? Pets:  Dogs (1)  ? Living Situation: Lives alone   ? 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 Education: [employer] ?  Tobacco Use/Exposure: None   ? Alcohol Use: Occasional   ? Drug Use:  None  ? Diet:  Regular  ? Exercise:  Gym [facility])   ? Hobbies:  Pets, TV  ?   ?   ?   ?   ?   ? ?Social Determinants of Health  ? ?Financial Resource Strain: Not on file  ?Food Insecurity: Not on file  ?Transportation Needs: Not on file  ?Physical Activity: Not on file  ?Stress: Not on file  ?Social Connections: Not on file  ?Intimate Partner Violence: Not on file  ? ?Social History  ? ?Tobacco Use  ?Smoking Status Never  ?Smokeless Tobacco Never  ? ?Social History  ? ?Substance and Sexual Activity  ?Alcohol Use Yes  ? Comment: She has had very limited intake since she has been working on her diet.    ? ? ?Family History:  ?Family History  ?Problem Relation Age of Onset  ? Hypertension Mother   ? Hyperlipidemia Mother   ?  Depression Mother   ? Hypertension Father   ? Depression Sister   ? CVA Other   ? Heart disease Maternal Grandmother   ? Cancer Maternal Grandmother   ?     colon cancer  ? Vascular Disease Other   ? Colon cancer Other   ? ? ?Past medical history, surgical history, medications, allergies, family history and social history reviewed with patient today and changes made to appropriate areas of the chart.  ? ?All ROS negative except what is listed above and in the HPI.  ? ?   ?Objective:  ?  ?BP 121/82   Pulse 84   Ht '5\' 4"'$  (1.626 m)   Wt 254 lb (115.2 kg)   LMP 08/09/2018   BMI 43.60 kg/m?   ?Wt Readings from Last 3 Encounters:  ?12/23/21 254 lb (115.2 kg)  ?11/05/21 253 lb (114.8 kg)  ?04/23/14 176 lb (79.8 kg)  ?  ?Physical Exam ?Vitals reviewed.  ?Constitutional:   ?   Appearance: Normal appearance. She is obese.  ?HENT:  ?   Head: Normocephalic and atraumatic.  ?   Right Ear: Tympanic membrane normal.  ?   Left Ear: Tympanic membrane normal.  ?   Nose: Nose normal.  ?   Mouth/Throat:  ?   Mouth: Mucous membranes are moist.  ?   Pharynx: Oropharynx is clear.  ?Eyes:  ?   Extraocular Movements: Extraocular movements intact.  ?   Conjunctiva/sclera: Conjunctivae normal.  ?   Pupils: Pupils are equal, round, and reactive to light.  ?Cardiovascular:  ?   Rate and Rhythm: Normal rate and regular rhythm.  ?Pulmonary:  ?   Effort: Pulmonary effort is normal.  ?   Breath sounds: Normal breath sounds.  ?Abdominal:  ?   General: Abdomen is flat. Bowel sounds are normal.  ?   Palpations: Abdomen is soft.  ?Genitourinary: ?   General: Normal vulva.  ?   Vagina: No vaginal discharge.  ?Musculoskeletal:     ?   General: Normal range of motion.  ?   Cervical back: Normal range of motion and neck supple.  ?Skin: ?   General: Skin is warm and dry.  ?   Capillary Refill: Capillary refill takes less than 2 seconds.  ?Neurological:  ?   General: No focal deficit present.  ?   Mental Status: She is alert and oriented to person,  place, and time. Mental status is at baseline.  ?Psychiatric:     ?  Mood and Affect: Mood normal.     ?   Behavior: Behavior normal.     ?   Thought Content: Thought content normal.     ?   Judgment: Judgment normal.  ? ? ? ? ?Results for orders placed or performed in visit on 11/05/21  ?HM MAMMOGRAPHY  ?Result Value Ref Range  ? HM Mammogram 0-4 Bi-Rad 0-4 Bi-Rad, Self Reported Normal  ? ?   ?Assessment & Plan:  ? ?Problem List Items Addressed This Visit   ? ?  ? Cardiovascular and Mediastinum  ? Essential hypertension, benign  ? Relevant Medications  ? losartan-hydrochlorothiazide (HYZAAR) 100-25 MG tablet  ? Other Relevant Orders  ? CBC  ? Comprehensive metabolic panel  ? Lipid panel  ? TSH  ?  ? Other  ? Depression  ? Relevant Medications  ? FLUoxetine (PROZAC) 10 MG capsule  ? Vitamin D deficiency  ? Relevant Orders  ? Vitamin D (25 hydroxy)  ? Attention and concentration deficit  ? Relevant Medications  ? lisdexamfetamine (VYVANSE) 70 MG capsule (Start on 01/08/2022)  ? lisdexamfetamine (VYVANSE) 70 MG capsule (Start on 02/07/2022)  ? lisdexamfetamine (VYVANSE) 70 MG capsule (Start on 03/10/2022)  ? Obesity, unspecified  ? Relevant Medications  ? lisdexamfetamine (VYVANSE) 70 MG capsule (Start on 01/08/2022)  ? lisdexamfetamine (VYVANSE) 70 MG capsule (Start on 02/07/2022)  ? lisdexamfetamine (VYVANSE) 70 MG capsule (Start on 03/10/2022)  ? ?Other Visit Diagnoses   ? ? Cervical cancer screening    -  Primary  ? Relevant Orders  ? Cytology - PAP( [place])  ? Cervicovaginal ancillary only  ? Localized swelling of left lower leg      ? Relevant Orders  ? Ambulatory referral to Sports Medicine  ? Encounter for routine adult health examination with abnormal findings      ? Relevant Orders  ? CBC  ? Comprehensive metabolic panel  ? Lipid panel  ? TSH  ? Screen for colon cancer      ? Relevant Orders  ? Ambulatory referral to Gastroenterology  ? ?  ?  ? ?PDMP reviewed ? ?LABORATORY TESTING:  ?- Health maintenance  labs ordered today as discussed above.  ?         CBC, CMP, LIPIDS ?         TSH - high risk or symptoms ?         A1c - hx, high risk, symptomatic  ?- STI testing: deferred ?(USPSTF recommends Gonorrhea/Chlamydia screening (urin

## 2021-12-23 NOTE — Addendum Note (Signed)
Addended by: Jamesetta So on: 12/23/2021 03:39 PM ? ? Modules accepted: Orders ? ?

## 2021-12-24 ENCOUNTER — Encounter: Payer: Self-pay | Admitting: Internal Medicine

## 2021-12-25 LAB — CERVICOVAGINAL ANCILLARY ONLY
Bacterial Vaginitis (gardnerella): POSITIVE — AB
Candida Glabrata: NEGATIVE
Candida Vaginitis: NEGATIVE
Comment: NEGATIVE
Comment: NEGATIVE
Comment: NEGATIVE

## 2021-12-25 MED ORDER — METRONIDAZOLE 500 MG PO TABS: 500.0000 mg | ORAL_TABLET | Freq: Two times a day (BID) | ORAL | 0 refills | Status: AC

## 2021-12-25 NOTE — Addendum Note (Signed)
Addended by: Caleen Jobs B on: 12/25/2021 12:11 PM ? ? Modules accepted: Orders ? ?

## 2021-12-28 ENCOUNTER — Other Ambulatory Visit (HOSPITAL_BASED_OUTPATIENT_CLINIC_OR_DEPARTMENT_OTHER): Payer: Self-pay | Admitting: Family Medicine

## 2021-12-28 ENCOUNTER — Telehealth: Payer: Self-pay | Admitting: Family Medicine

## 2021-12-28 DIAGNOSIS — Z1231 Encounter for screening mammogram for malignant neoplasm of breast: Secondary | ICD-10-CM

## 2021-12-28 LAB — CYTOLOGY - PAP
Comment: NEGATIVE
Diagnosis: UNDETERMINED — AB
High risk HPV: NEGATIVE

## 2021-12-28 NOTE — Telephone Encounter (Signed)
Opened in error

## 2022-02-02 ENCOUNTER — Encounter: Payer: 59 | Admitting: Internal Medicine

## 2022-02-23 ENCOUNTER — Other Ambulatory Visit: Payer: 59

## 2022-02-23 ENCOUNTER — Ambulatory Visit (AMBULATORY_SURGERY_CENTER): Payer: 59 | Admitting: *Deleted

## 2022-02-23 VITALS — Ht 64.0 in | Wt 254.0 lb

## 2022-02-23 DIAGNOSIS — Z1211 Encounter for screening for malignant neoplasm of colon: Secondary | ICD-10-CM

## 2022-02-23 MED ORDER — NA SULFATE-K SULFATE-MG SULF 17.5-3.13-1.6 GM/177ML PO SOLN: 1.0000 | ORAL | 0 refills | Status: DC

## 2022-02-23 MED ORDER — ONDANSETRON HCL 4 MG PO TABS: 4.0000 mg | ORAL_TABLET | ORAL | 0 refills | Status: DC

## 2022-02-23 NOTE — Progress Notes (Signed)
Patient's pre-visit was done today over the phone with the patient. Name,DOB and address verified. Patient denies any allergies to Eggs and Soy. Patient denies any problems with anesthesia/sedation. Patient is not taking any diet pills or blood thinners. No home Oxygen. Insurance confirmed with patient. Pt denies constipation.   Prep instructions sent to pt's MyChart -pt is aware. Patient understands to call us back with any questions or concerns. Patient is aware of our care-partner policy.   EMMI education assigned to the patient for the procedure, sent to [facility].

## 2022-03-01 ENCOUNTER — Other Ambulatory Visit: Payer: 59

## 2022-03-02 ENCOUNTER — Other Ambulatory Visit (INDEPENDENT_AMBULATORY_CARE_PROVIDER_SITE_OTHER): Payer: 59

## 2022-03-02 ENCOUNTER — Ambulatory Visit: Payer: 59 | Admitting: Family Medicine

## 2022-03-02 DIAGNOSIS — E559 Vitamin D deficiency, unspecified: Secondary | ICD-10-CM

## 2022-03-02 DIAGNOSIS — Z0001 Encounter for general adult medical examination with abnormal findings: Secondary | ICD-10-CM

## 2022-03-02 DIAGNOSIS — I10 Essential (primary) hypertension: Secondary | ICD-10-CM

## 2022-03-02 LAB — CBC
HCT: 40.1 % (ref 36.0–46.0)
Hemoglobin: 13 g/dL (ref 12.0–15.0)
MCHC: 32.4 g/dL (ref 30.0–36.0)
MCV: 80.6 fl (ref 78.0–100.0)
Platelets: 244 10*3/uL (ref 150.0–400.0)
RBC: 4.97 Mil/uL (ref 3.87–5.11)
RDW: 13.2 % (ref 11.5–15.5)
WBC: 7 10*3/uL (ref 4.0–10.5)

## 2022-03-02 LAB — COMPREHENSIVE METABOLIC PANEL
ALT: 11 U/L (ref 0–35)
AST: 13 U/L (ref 0–37)
Albumin: 4.1 g/dL (ref 3.5–5.2)
Alkaline Phosphatase: 64 U/L (ref 39–117)
BUN: 19 mg/dL (ref 6–23)
CO2: 29 mEq/L (ref 19–32)
Calcium: 9.6 mg/dL (ref 8.4–10.5)
Chloride: 101 mEq/L (ref 96–112)
Creatinine, Ser: 0.82 mg/dL (ref 0.40–1.20)
GFR: 80.91 mL/min (ref >60.00)
Glucose, Bld: 94 mg/dL (ref 70–99)
Potassium: 4.9 mEq/L (ref 3.5–5.1)
Sodium: 138 mEq/L (ref 135–145)
Total Bilirubin: 0.5 mg/dL (ref 0.2–1.2)
Total Protein: 6.7 g/dL (ref 6.0–8.3)

## 2022-03-02 LAB — LIPID PANEL
Cholesterol: 221 mg/dL — ABNORMAL HIGH (ref 0–200)
HDL: 74 mg/dL (ref >39.00)
LDL Cholesterol: 129 mg/dL — ABNORMAL HIGH (ref 0–99)
NonHDL: 146.87
Total CHOL/HDL Ratio: 3
Triglycerides: 90 mg/dL (ref 0.0–149.0)
VLDL: 18 mg/dL (ref 0.0–40.0)

## 2022-03-02 LAB — TSH: TSH: 2.99 u[IU]/mL (ref 0.35–5.50)

## 2022-03-02 LAB — VITAMIN D 25 HYDROXY (VIT D DEFICIENCY, FRACTURES): VITD: 22.83 ng/mL — ABNORMAL LOW (ref 30.00–100.00)

## 2022-03-02 NOTE — Progress Notes (Signed)
Vitamin D is low - Start taking 800 to 1,000 mg of Vitamin D supplement over the counter if you are not already. If you are, then increase your daily dose by 1,000 mg. Cholesterol is slightly elevated, but not high enough to need medications at this time. Recommend starting with lifestyle measures. Other labs are stable. No changes.   Lifestyle factors for lowering cholesterol include: Diet therapy - heart-healthy diet rich in fruits, veggies, fiber-rich whole grains, lean meats, chicken, fish (at least twice a week), fat-free or 1% dairy products; foods low in saturated/trans fats, cholesterol, sodium, and sugar. Mediterranean diet has shown to be very heart healthy. Regular exercise - recommend at least 30 minutes a day, 5 times per week Weight management     The 10-year ASCVD risk score (Arnett DK, et al., 2019) is: 1.8%   Values used to calculate the score:     Age: 56 years     Sex: Female     Is Non-Hispanic African American: No     Diabetic: No     Tobacco smoker: No     Systolic Blood Pressure: 023 mmHg     Is BP treated: Yes     HDL Cholesterol: 74 mg/dL     Total Cholesterol: 221 mg/dL

## 2022-03-03 ENCOUNTER — Ambulatory Visit (INDEPENDENT_AMBULATORY_CARE_PROVIDER_SITE_OTHER): Payer: 59 | Admitting: Family Medicine

## 2022-03-03 ENCOUNTER — Encounter: Payer: Self-pay | Admitting: Family Medicine

## 2022-03-03 VITALS — BP 131/86 | HR 82 | Ht 64.0 in | Wt 255.6 lb

## 2022-03-03 DIAGNOSIS — R41840 Attention and concentration deficit: Secondary | ICD-10-CM | POA: Diagnosis not present

## 2022-03-03 MED ORDER — LISDEXAMFETAMINE DIMESYLATE 70 MG PO CAPS: 70.0000 mg | ORAL_CAPSULE | Freq: Every day | ORAL | 0 refills | Status: DC

## 2022-03-03 MED ORDER — MUPIROCIN 2 % EX OINT
TOPICAL_OINTMENT | CUTANEOUS | 3 refills | Status: DC
Start: 2022-03-03 — End: 2022-08-18

## 2022-03-03 NOTE — Progress Notes (Signed)
   Established Patient Office Visit  Subjective   Patient ID: Janet Glover, female    DOB: March 31, 1966  Age: 56 y.o. MRN: [id number]  CC: ADHD follow-up   HPI Patient is here for routine ADHD follow-up. No new concerns. She had her CPE labs done yesterday and was recommended to start Vitamin D supplementation and lifestyle modifications for elevated cholesterol - can repeat at next follow-up.   ADHD: - Patient reports she is doing well on current Vyvanse. She has one more refill to pick up next week and then will need the next 3 months sent in. She denies any side effects or new concerns.       ROS All review of systems negative except what is listed in the HPI    Objective:     BP 131/86   Pulse 82   Ht '5\' 4"'$  (1.626 m)   Wt 255 lb 9.6 oz (115.9 kg)   LMP 08/09/2018   BMI 43.87 kg/m    Physical Exam Vitals reviewed.  Constitutional:      Appearance: Normal appearance.  Cardiovascular:     Rate and Rhythm: Normal rate and regular rhythm.  Pulmonary:     Effort: Pulmonary effort is normal.     Breath sounds: Normal breath sounds.  Neurological:     General: No focal deficit present.     Mental Status: She is alert and oriented to person, place, and time. Mental status is at baseline.  Psychiatric:        Mood and Affect: Mood normal.        Behavior: Behavior normal.        Thought Content: Thought content normal.        Judgment: Judgment normal.     No results found for any visits on 03/03/22.    The 10-year ASCVD risk score (Arnett DK, et al., 2019) is: 2.1%    Assessment & Plan:   1. Attention and concentration deficit Doing well. No new concerns. Sending in next 3 months' refills.  F/u in 3 months Patient aware of signs/symptoms requiring further/urgent evaluation.   - lisdexamfetamine (VYVANSE) 70 MG capsule; Take 1 capsule (70 mg total) by mouth daily.  Dispense: 30 capsule; Refill: 0 - lisdexamfetamine (VYVANSE) 70 MG capsule; Take 1 capsule  (70 mg total) by mouth daily.  Dispense: 30 capsule; Refill: 0 - lisdexamfetamine (VYVANSE) 70 MG capsule; Take 1 capsule (70 mg total) by mouth daily.  Dispense: 30 capsule; Refill: 0   Return in about 3 months (around 06/03/2022) for ADHD f/u, refills .    Terrilyn Saver, NP

## 2022-03-12 ENCOUNTER — Encounter: Payer: Self-pay | Admitting: Gastroenterology

## 2022-03-14 ENCOUNTER — Telehealth: Payer: Self-pay | Admitting: Internal Medicine

## 2022-03-14 NOTE — Telephone Encounter (Signed)
Screening colonoscopy for 6/20 0830 Dr. Tally Due + for Covid yesterday  She feels fine  Asking for advice about how to proceed I these circumstances  S/p vaccines x 2 + booster and hx Covid x 1  She will monitor for sxs and wants to take an Ag test tomorrow afternoon before starting prep - told her that was ok and if + let us know and we would cancel

## 2022-03-16 ENCOUNTER — Encounter: Payer: Self-pay | Admitting: Gastroenterology

## 2022-03-16 ENCOUNTER — Ambulatory Visit (AMBULATORY_SURGERY_CENTER): Payer: 59 | Admitting: Gastroenterology

## 2022-03-16 VITALS — BP 118/79 | HR 65 | Temp 97.8°F | Resp 13 | Ht 64.0 in | Wt 254.0 lb

## 2022-03-16 DIAGNOSIS — Z8371 Family history of colonic polyps: Secondary | ICD-10-CM | POA: Diagnosis not present

## 2022-03-16 DIAGNOSIS — K635 Polyp of colon: Secondary | ICD-10-CM

## 2022-03-16 DIAGNOSIS — Z1211 Encounter for screening for malignant neoplasm of colon: Secondary | ICD-10-CM | POA: Diagnosis not present

## 2022-03-16 DIAGNOSIS — D123 Benign neoplasm of transverse colon: Secondary | ICD-10-CM

## 2022-03-16 MED ORDER — SODIUM CHLORIDE 0.9 % IV SOLN: 500.0000 mL | Freq: Once | INTRAVENOUS | Status: DC

## 2022-03-16 NOTE — Progress Notes (Signed)
Referring Provider: Terrilyn Saver, NP Primary Care Physician:  Terrilyn Saver, NP  Indication for Procedure:  Colon cancer screening   IMPRESSION:  Need for colon cancer screening Appropriate candidate for monitored anesthesia care  PLAN: Colonoscopy in the [facility] today   HPI: Janet Glover is a 56 y.o. female presents for screening colonoscopy.  No prior colonoscopy or colon cancer screening.  No baseline GI symptoms.   Sister with colon polyps. Maternal grandmother with colon cancer. No other known family history of colon cancer or polyps. No family history of uterine/endometrial cancer, pancreatic cancer or gastric/stomach cancer.   Past Medical History:  Diagnosis Date   Anxiety    Attention and concentration deficit    Bronchitis    Bunion    Depression    Hypertension    Obesity    PMDD (premenstrual dysphoric disorder)    Vitamin D deficiency     Past Surgical History:  Procedure Laterality Date   BUNIONECTOMY     COLONOSCOPY  2004   normal exam per pt done in [place]   lipoma removal     TONSILLECTOMY AND ADENOIDECTOMY      Current Outpatient Medications  Medication Sig Dispense Refill   FLUoxetine (PROZAC) 10 MG capsule Take 1 capsule (10 mg total) by mouth daily. 90 capsule 1   [START ON 04/09/2022] lisdexamfetamine (VYVANSE) 70 MG capsule Take 1 capsule (70 mg total) by mouth daily. 30 capsule 0   losartan-hydrochlorothiazide (HYZAAR) 100-25 MG tablet Take 1 tablet by mouth every morning. 90 tablet 1   mupirocin ointment (BACTROBAN) 2 % Three times daily PRN 30 g 3   [START ON 05/10/2022] lisdexamfetamine (VYVANSE) 70 MG capsule Take 1 capsule (70 mg total) by mouth daily. 30 capsule 0   [START ON 06/10/2022] lisdexamfetamine (VYVANSE) 70 MG capsule Take 1 capsule (70 mg total) by mouth daily. 30 capsule 0   ondansetron (ZOFRAN) 4 MG tablet Take 1 tablet (4 mg total) by mouth as directed. Take one Zofran pill 30-60 minutes before each colonoscopy  prep dose 2 tablet 0   Current Facility-Administered Medications  Medication Dose Route Frequency Provider Last Rate Last Admin   0.9 %  sodium chloride infusion  500 mL Intravenous Once Thornton Park, MD        Allergies as of 03/16/2022 - Review Complete 03/16/2022  Allergen Reaction Noted   Ace inhibitors Other (See Comments) 12/06/2012    Family History  Problem Relation Age of Onset   Hypertension Mother    Hyperlipidemia Mother    Depression Mother    Hypertension Father    Colon polyps Sister    Depression Sister    Colon cancer Maternal Grandmother    Heart disease Maternal Grandmother    Cancer Maternal Grandmother        colon cancer   CVA Other    Vascular Disease Other      Physical Exam: General:   Alert,  well-nourished, pleasant and cooperative in NAD Head:  Normocephalic and atraumatic. Eyes:  Sclera clear, no icterus.   Conjunctiva pink. Mouth:  No deformity or lesions.   Neck:  Supple; no masses or thyromegaly. Lungs:  Clear throughout to auscultation.   No wheezes. Heart:  Regular rate and rhythm; no murmurs. Abdomen:  Soft, non-tender, nondistended, normal bowel sounds, no rebound or guarding.  Msk:  Symmetrical. No boney deformities LAD: No inguinal or umbilical LAD Extremities:  No clubbing or edema. Neurologic:  Alert and  oriented x4;  grossly nonfocal Skin:  No obvious rash or bruise. Psych:  Alert and cooperative. Normal mood and affect.     Studies/Results: No results found.    Janet Glover L. Tarri Glenn, MD, MPH 03/16/2022, 8:32 AM

## 2022-03-16 NOTE — Patient Instructions (Signed)
Handout on polyps and diverticulosis given.   YOU HAD AN ENDOSCOPIC PROCEDURE TODAY AT [facility]:   Refer to the procedure report that was given to you for any specific questions about what was found during the examination.  If the procedure report does not answer your questions, please call your gastroenterologist to clarify.  If you requested that your care partner not be given the details of your procedure findings, then the procedure report has been included in a sealed envelope for you to review at your convenience later.  YOU SHOULD EXPECT: Some feelings of bloating in the abdomen. Passage of more gas than usual.  Walking can help get rid of the air that was put into your GI tract during the procedure and reduce the bloating. If you had a lower endoscopy (such as a colonoscopy or flexible sigmoidoscopy) you may notice spotting of blood in your stool or on the toilet paper. If you underwent a bowel prep for your procedure, you may not have a normal bowel movement for a few days.  Please Note:  You might notice some irritation and congestion in your nose or some drainage.  This is from the oxygen used during your procedure.  There is no need for concern and it should clear up in a day or so.  SYMPTOMS TO REPORT IMMEDIATELY:  Following lower endoscopy (colonoscopy or flexible sigmoidoscopy):  Excessive amounts of blood in the stool  Significant tenderness or worsening of abdominal pains  Swelling of the abdomen that is new, acute  Fever of 100F or higher   For urgent or emergent issues, a gastroenterologist can be reached at any hour by calling [phone]. Do not use MyChart messaging for urgent concerns.    DIET:  We do recommend a small meal at first, but then you may proceed to your regular diet.  Drink plenty of fluids but you should avoid alcoholic beverages for 24 hours.  ACTIVITY:  You should plan to take it easy for the rest of today and you should NOT DRIVE  or use heavy machinery until tomorrow (because of the sedation medicines used during the test).    FOLLOW UP: Our staff will call the number listed on your records 24-72 hours following your procedure to check on you and address any questions or concerns that you may have regarding the information given to you following your procedure. If we do not reach you, we will leave a message.  We will attempt to reach you two times.  During this call, we will ask if you have developed any symptoms of COVID 19. If you develop any symptoms (ie: fever, flu-like symptoms, shortness of breath, cough etc.) before then, please call [phone].  If you test positive for Covid 19 in the 2 weeks post procedure, please call and report this information to [place].    If any biopsies were taken you will be contacted by phone or by letter within the next 1-3 weeks.  Please call us at [phone] if you have not heard about the biopsies in 3 weeks.    SIGNATURES/CONFIDENTIALITY: You and/or your care partner have signed paperwork which will be entered into your electronic medical record.  These signatures attest to the fact that that the information above on your After Visit Summary has been reviewed and is understood.  Full responsibility of the confidentiality of this discharge information lies with you and/or your care-partner.

## 2022-03-16 NOTE — Progress Notes (Signed)
Pt's states no medical or surgical changes since previsit or office visit. 

## 2022-03-16 NOTE — Progress Notes (Signed)
VSS, transported to PACU °

## 2022-03-16 NOTE — Op Note (Signed)
[place] Patient Name: Janet Glover Procedure Date: 03/16/2022 8:31 AM MRN: [id number] Endoscopist: Thornton Park MD, MD Age: 56 Referring MD:  Date of Birth: [birth year] Gender: Female Account #: [id number] Procedure:                Colonoscopy Indications:              Screening for colorectal malignant neoplasm, This                            is the patient's first colonoscopy                           Sister has had colon polyps requiring short                            interval surveillance colonoscopy                           Grandmother may have had colon cancer Medicines:                Monitored Anesthesia Care Procedure:                Pre-Anesthesia Assessment:                           - Prior to the procedure, a History and Physical                            was performed, and patient medications and                            allergies were reviewed. The patient's tolerance of                            previous anesthesia was also reviewed. The risks                            and benefits of the procedure and the sedation                            options and risks were discussed with the patient.                            All questions were answered, and informed consent                            was obtained. Prior Anticoagulants: The patient has                            taken no previous anticoagulant or antiplatelet                            agents. ASA Grade Assessment: II - A patient with  mild systemic disease. After reviewing the risks                            and benefits, the patient was deemed in                            satisfactory condition to undergo the procedure.                           After obtaining informed consent, the colonoscope                            was passed under direct vision. Throughout the                            procedure, the patient's blood pressure, pulse, and                             oxygen saturations were monitored continuously. The                            CF [id number] #[id number] was introduced through the anus                            and advanced to the 3 cm into the ileum. A second                            forward view of the right colon was performed. The                            colonoscopy was performed without difficulty. The                            patient tolerated the procedure well. The quality                            of the bowel preparation was good. The terminal                            ileum, ileocecal valve, appendiceal orifice, and                            rectum were photographed. Scope In: 8:41:24 AM Scope Out: 8:57:32 AM Scope Withdrawal Time: 0 hours 12 minutes 13 seconds  Total Procedure Duration: 0 hours 16 minutes 8 seconds  Findings:                 The perianal and digital rectal examinations were                            normal.                           A few large-mouthed diverticula were found in the  sigmoid colon and ascending colon.                           Two sessile polyps were found in the hepatic                            flexure. The polyps were 4 to 10 mm in size. These                            polyps were removed with a cold snare. Resection                            and retrieval were complete. Estimated blood loss                            was minimal.                           The exam was otherwise without abnormality on                            direct and retroflexion views. Complications:            No immediate complications. Estimated Blood Loss:     Estimated blood loss was minimal. Impression:               - Diverticulosis in the sigmoid colon and in the                            ascending colon.                           - Two 4 to 10 mm polyps at the hepatic flexure,                            removed with a cold snare. Resected and retrieved.                            - The examination was otherwise normal on direct                            and retroflexion views. Recommendation:           - Patient has a contact number available for                            emergencies. The signs and symptoms of potential                            delayed complications were discussed with the                            patient. Return to normal activities tomorrow.  Written discharge instructions were provided to the                            patient.                           - Resume previous diet. High fiber diet recommended.                           - Continue present medications.                           - Await pathology results.                           - Repeat colonoscopy date to be determined after                            pending pathology results are reviewed for                            surveillance.                           - Emerging evidence supports eating a diet of                            fruits, vegetables, grains, calcium, and yogurt                            while reducing red meat and alcohol may reduce the                            risk of colon cancer.                           - Thank you for allowing me to be involved in your                            colon cancer prevention. Thornton Park MD, MD 03/16/2022 9:02:14 AM This report has been signed electronically.

## 2022-03-16 NOTE — Progress Notes (Signed)
Called to room to assist during endoscopic procedure.  Patient ID and intended procedure confirmed with present staff. Received instructions for my participation in the procedure from the performing physician.  

## 2022-03-17 ENCOUNTER — Telehealth: Payer: Self-pay

## 2022-03-17 NOTE — Telephone Encounter (Signed)
Left message

## 2022-03-18 ENCOUNTER — Encounter: Payer: Self-pay | Admitting: Gastroenterology

## 2022-03-19 ENCOUNTER — Encounter: Payer: Self-pay | Admitting: Gastroenterology

## 2022-03-29 ENCOUNTER — Ambulatory Visit (HOSPITAL_BASED_OUTPATIENT_CLINIC_OR_DEPARTMENT_OTHER)
Admission: RE | Admit: 2022-03-29 | Discharge: 2022-03-29 | Disposition: A | Discharge status: Home / Self Care | Payer: 59 | Source: Ambulatory Visit | Attending: Family Medicine | Admitting: Family Medicine

## 2022-03-29 ENCOUNTER — Encounter (HOSPITAL_BASED_OUTPATIENT_CLINIC_OR_DEPARTMENT_OTHER): Payer: Self-pay

## 2022-03-29 DIAGNOSIS — Z1231 Encounter for screening mammogram for malignant neoplasm of breast: Secondary | ICD-10-CM | POA: Insufficient documentation

## 2022-04-23 DIAGNOSIS — D485 Neoplasm of uncertain behavior of skin: Secondary | ICD-10-CM | POA: Diagnosis not present

## 2022-04-23 DIAGNOSIS — L281 Prurigo nodularis: Secondary | ICD-10-CM | POA: Diagnosis not present

## 2022-04-23 DIAGNOSIS — D2262 Melanocytic nevi of left upper limb, including shoulder: Secondary | ICD-10-CM | POA: Diagnosis not present

## 2022-04-23 DIAGNOSIS — D2261 Melanocytic nevi of right upper limb, including shoulder: Secondary | ICD-10-CM | POA: Diagnosis not present

## 2022-04-23 DIAGNOSIS — D1801 Hemangioma of skin and subcutaneous tissue: Secondary | ICD-10-CM | POA: Diagnosis not present

## 2022-04-23 DIAGNOSIS — L905 Scar conditions and fibrosis of skin: Secondary | ICD-10-CM | POA: Diagnosis not present

## 2022-04-23 DIAGNOSIS — L738 Other specified follicular disorders: Secondary | ICD-10-CM | POA: Diagnosis not present

## 2022-04-23 DIAGNOSIS — D225 Melanocytic nevi of trunk: Secondary | ICD-10-CM | POA: Diagnosis not present

## 2022-04-23 DIAGNOSIS — L821 Other seborrheic keratosis: Secondary | ICD-10-CM | POA: Diagnosis not present

## 2022-04-23 DIAGNOSIS — D2271 Melanocytic nevi of right lower limb, including hip: Secondary | ICD-10-CM | POA: Diagnosis not present

## 2022-04-23 DIAGNOSIS — L570 Actinic keratosis: Secondary | ICD-10-CM | POA: Diagnosis not present

## 2022-04-23 DIAGNOSIS — D2361 Other benign neoplasm of skin of right upper limb, including shoulder: Secondary | ICD-10-CM | POA: Diagnosis not present

## 2022-05-19 DIAGNOSIS — M722 Plantar fascial fibromatosis: Secondary | ICD-10-CM | POA: Diagnosis not present

## 2022-05-19 DIAGNOSIS — M2142 Flat foot [pes planus] (acquired), left foot: Secondary | ICD-10-CM | POA: Diagnosis not present

## 2022-05-19 DIAGNOSIS — M79672 Pain in left foot: Secondary | ICD-10-CM | POA: Diagnosis not present

## 2022-05-19 DIAGNOSIS — M76822 Posterior tibial tendinitis, left leg: Secondary | ICD-10-CM | POA: Diagnosis not present

## 2022-05-25 ENCOUNTER — Ambulatory Visit: Payer: 59 | Admitting: Family

## 2022-06-01 ENCOUNTER — Ambulatory Visit (INDEPENDENT_AMBULATORY_CARE_PROVIDER_SITE_OTHER): Payer: 59 | Admitting: Family

## 2022-06-01 VITALS — BP 118/80 | HR 87 | Temp 97.8°F | Resp 18 | Ht 64.0 in | Wt 256.0 lb

## 2022-06-01 DIAGNOSIS — I10 Essential (primary) hypertension: Secondary | ICD-10-CM | POA: Diagnosis not present

## 2022-06-01 DIAGNOSIS — F909 Attention-deficit hyperactivity disorder, unspecified type: Secondary | ICD-10-CM | POA: Diagnosis not present

## 2022-06-01 DIAGNOSIS — R41840 Attention and concentration deficit: Secondary | ICD-10-CM

## 2022-06-01 DIAGNOSIS — F3281 Premenstrual dysphoric disorder: Secondary | ICD-10-CM

## 2022-06-01 DIAGNOSIS — Z6841 Body Mass Index (BMI) 40.0 and over, adult: Secondary | ICD-10-CM

## 2022-06-01 DIAGNOSIS — R69 Illness, unspecified: Secondary | ICD-10-CM | POA: Diagnosis not present

## 2022-06-01 MED ORDER — LISDEXAMFETAMINE DIMESYLATE 70 MG PO CAPS: 70.0000 mg | ORAL_CAPSULE | Freq: Every day | ORAL | 0 refills | Status: DC

## 2022-06-01 MED ORDER — LOSARTAN POTASSIUM-HCTZ 100-25 MG PO TABS
1.0000 | ORAL_TABLET | Freq: Every morning | ORAL | 1 refills | Status: DC
Start: 2022-06-01 — End: 2022-12-20

## 2022-06-01 NOTE — Progress Notes (Signed)
Janet Glover is a 56 y.o. female with the following history as recorded in EpicCare:  Patient Active Problem List   Diagnosis Date Noted   PMDD (premenstrual dysphoric disorder) 03/18/2014   Obesity, unspecified 12/06/2013   Obesity (BMI 30-39.9) 09/11/2013   Adult ADHD 09/01/2013   Essential hypertension, benign 04/08/2013   Depression    Obesity    Vitamin D deficiency    Premenstrual tension syndrome    Attention and concentration deficit    Bunion     Current Outpatient Medications  Medication Sig Dispense Refill   FLUoxetine (PROZAC) 10 MG capsule Take 1 capsule (10 mg total) by mouth daily. 90 capsule 1   lisdexamfetamine (VYVANSE) 70 MG capsule Take 1 capsule (70 mg total) by mouth daily. 30 capsule 0   lisdexamfetamine (VYVANSE) 70 MG capsule Take 1 capsule (70 mg total) by mouth daily. 30 capsule 0   [START ON 06/10/2022] lisdexamfetamine (VYVANSE) 70 MG capsule Take 1 capsule (70 mg total) by mouth daily. 30 capsule 0   mupirocin ointment (BACTROBAN) 2 % Three times daily PRN 30 g 3   ondansetron (ZOFRAN) 4 MG tablet Take 1 tablet (4 mg total) by mouth as directed. Take one Zofran pill 30-60 minutes before each colonoscopy prep dose 2 tablet 0   losartan-hydrochlorothiazide (HYZAAR) 100-25 MG tablet Take 1 tablet by mouth every morning. 90 tablet 1   No current facility-administered medications for this visit.    Allergies: Ace inhibitors  Past Medical History:  Diagnosis Date   Anxiety    Attention and concentration deficit    Bronchitis    Bunion    Depression    Hypertension    Obesity    PMDD (premenstrual dysphoric disorder)    Vitamin D deficiency     Past Surgical History:  Procedure Laterality Date   BUNIONECTOMY     COLONOSCOPY  2004   normal exam per [place] done in [place]   lipoma removal     TONSILLECTOMY AND ADENOIDECTOMY      Family History  Problem Relation Age of Onset   Hypertension Mother    Hyperlipidemia Mother    Depression Mother     Colon polyps Father        sessile serrated adenoma   Hypertension Father    Colon polyps Sister    Depression Sister    Colon cancer Maternal Grandmother    Heart disease Maternal Grandmother    Cancer Maternal Grandmother        colon cancer   CVA Other    Vascular Disease Other     Social History   Tobacco Use   Smoking status: Never   Smokeless tobacco: Never  Substance Use Topics   Alcohol use: Yes    Comment: beer 1-2 times a month per pt    Subjective:  3 month follow up on ADD; stable on Vyvanse; her PCP is on maternity leave- asks that prescriptions be sent to pharmacy on file to fill at later date;  Is very frustrated with weight- has not been able to get Wegovy due to insurance issues; wonders if she is a candidate for Ozempic; she and PCP have discussed potential referral to [facility] previously;   Would also like to increase dosage of Prozac- feeling very "wound up." Is also on course of prednisone for her foot and notes that is causing her to eat and feeling hungry all the time;      Objective:  Vitals:  06/01/22 0912  BP: 118/80  Pulse: 87  Resp: 18  Temp: 97.8 F (36.6 C)  TempSrc: Temporal  SpO2: 97%  Weight: 256 lb (116.1 kg)  Height: '5\' 4"'$  (1.626 m)    General: Well developed, well nourished, in no acute distress  Skin : Warm and dry.  Head: Normocephalic and atraumatic  Lungs: Respirations unlabored; clear to auscultation bilaterally without wheeze, rales, rhonchi  CVS exam: normal rate and regular rhythm.  Abdomen: Soft; nontender; nondistended; normoactive bowel sounds; no masses or hepatosplenomegaly  Musculoskeletal: No deformities; no active joint inflammation  Extremities: No edema, cyanosis, clubbing  Vessels: Symmetric bilaterally  Neurologic: Alert and oriented; speech intact; face symmetrical; moves all extremities well; CNII-XII intact without focal deficit   Assessment:  1. Class 3 severe obesity with  serious comorbidity and body mass index (BMI) of 40.0 to 44.9 in adult, unspecified obesity type ([place])   2. BMI 40.0-44.9, adult ([place])   3. Essential hypertension, benign   4. Adult ADHD   5. PMDD (premenstrual dysphoric disorder)     Plan:  & 2. Explained to patient Ozempic is indicated for diabetes- cannot get this approved for weight loss purposes; her insurance would not cover Wegovy previously; will refer to [facility]; 3.   Stable; refill updated; 4.   Stable;  5.   Patient will try increasing Prozac to 20 mg daily; she will plan to see her PCP in 3 months; may want to consider HRT and/or GYN referral if symptoms remain problematic.   No follow-ups on file.  Orders Placed This Encounter  Procedures   Amb Ref to [facility]    Referral Priority:   Routine    Referral Type:   Consultation    Number of Visits Requested:   1    Requested Prescriptions   Signed Prescriptions Disp Refills   losartan-hydrochlorothiazide (HYZAAR) 100-25 MG tablet 90 tablet 1    Sig: Take 1 tablet by mouth every morning.

## 2022-06-16 DIAGNOSIS — M79672 Pain in left foot: Secondary | ICD-10-CM | POA: Diagnosis not present

## 2022-06-16 DIAGNOSIS — M2142 Flat foot [pes planus] (acquired), left foot: Secondary | ICD-10-CM | POA: Diagnosis not present

## 2022-06-16 DIAGNOSIS — M76822 Posterior tibial tendinitis, left leg: Secondary | ICD-10-CM | POA: Diagnosis not present

## 2022-06-16 DIAGNOSIS — M722 Plantar fascial fibromatosis: Secondary | ICD-10-CM | POA: Diagnosis not present

## 2022-06-23 ENCOUNTER — Encounter: Payer: Self-pay | Admitting: Family

## 2022-06-23 ENCOUNTER — Other Ambulatory Visit: Payer: Self-pay | Admitting: Family Medicine

## 2022-06-23 DIAGNOSIS — F32A Depression, unspecified: Secondary | ICD-10-CM

## 2022-06-23 NOTE — Telephone Encounter (Signed)
Received Prozac refill request for '10mg'$  capsule, once daily dosing.  Pt seen by NP, Valere Dross on 9/5 and was advised to increase dose to '20mg'$  daily.  Sent mychart message to pt to see if she is still on and tolerating '20mg'$  dose?  If so, does pt want [place] to send in '20mg'$  dose once daily.

## 2022-06-24 ENCOUNTER — Other Ambulatory Visit: Payer: Self-pay | Admitting: Family

## 2022-06-24 MED ORDER — WEGOVY 0.25 MG/0.5ML ~~LOC~~ SOAJ
0.2500 mg | SUBCUTANEOUS | 0 refills | Status: DC
Start: 2022-06-24 — End: 2022-08-18

## 2022-06-25 NOTE — Telephone Encounter (Addendum)
Spoke with pt to clarify mychart response to Prozac.  Pt states she still has a couple bottles of the '10mg'$  dose and she will continue to take 2 of those daily until she completes her current supply then will request '20mg'$  once daily dose. She is aware of Wegovy and notes that she recently received generic vyvanse RX and thinks it is not working as well as the brand name. Pt will continue current Rx a little longer and will let us know if generic does not work for her.

## 2022-07-01 DIAGNOSIS — M722 Plantar fascial fibromatosis: Secondary | ICD-10-CM | POA: Diagnosis not present

## 2022-07-01 DIAGNOSIS — M76822 Posterior tibial tendinitis, left leg: Secondary | ICD-10-CM | POA: Diagnosis not present

## 2022-07-01 DIAGNOSIS — M79672 Pain in left foot: Secondary | ICD-10-CM | POA: Diagnosis not present

## 2022-07-01 DIAGNOSIS — M2142 Flat foot [pes planus] (acquired), left foot: Secondary | ICD-10-CM | POA: Diagnosis not present

## 2022-07-07 ENCOUNTER — Ambulatory Visit: Payer: 59 | Admitting: Dermatology

## 2022-08-06 DIAGNOSIS — L578 Other skin changes due to chronic exposure to nonionizing radiation: Secondary | ICD-10-CM | POA: Diagnosis not present

## 2022-08-06 DIAGNOSIS — D485 Neoplasm of uncertain behavior of skin: Secondary | ICD-10-CM | POA: Diagnosis not present

## 2022-08-06 DIAGNOSIS — D2361 Other benign neoplasm of skin of right upper limb, including shoulder: Secondary | ICD-10-CM | POA: Diagnosis not present

## 2022-08-06 DIAGNOSIS — L718 Other rosacea: Secondary | ICD-10-CM | POA: Diagnosis not present

## 2022-08-06 DIAGNOSIS — L570 Actinic keratosis: Secondary | ICD-10-CM | POA: Diagnosis not present

## 2022-08-18 ENCOUNTER — Encounter: Payer: Self-pay | Admitting: Family Medicine

## 2022-08-18 ENCOUNTER — Ambulatory Visit (INDEPENDENT_AMBULATORY_CARE_PROVIDER_SITE_OTHER): Payer: 59 | Admitting: Family Medicine

## 2022-08-18 VITALS — BP 124/77 | HR 84 | Ht 64.0 in | Wt 263.8 lb

## 2022-08-18 DIAGNOSIS — R41840 Attention and concentration deficit: Secondary | ICD-10-CM

## 2022-08-18 MED ORDER — MUPIROCIN 2 % EX OINT
TOPICAL_OINTMENT | CUTANEOUS | 3 refills | Status: DC
Start: 2022-08-18 — End: 2023-03-21

## 2022-08-18 MED ORDER — LISDEXAMFETAMINE DIMESYLATE 70 MG PO CAPS: 70.0000 mg | ORAL_CAPSULE | Freq: Every day | ORAL | 0 refills | Status: DC

## 2022-08-18 NOTE — Progress Notes (Signed)
   Established Patient Office Visit  Subjective   Patient ID: Janet Glover, female    DOB: 1965-10-02  Age: 56 y.o. MRN: [id number]  CC: med refills   HPI  Patient is here for ADD follow-up and Vyvanse refills. She prefers to wait until next visit to address other chronic conditions as she has no concerns at this time and labs were stable over the summer.   ADD: - Meds: Vyvanse 70 mg daily - Patient denies any chest pain, palpitations, dyspnea, wheezing, edema, recurrent headaches, vision changes.  - Doing well on current dose       ROS All review of systems negative except what is listed in the HPI     Objective:     BP 124/77   Pulse 84   Ht '5\' 4"'$  (1.626 m)   Wt 263 lb 12.8 oz (119.7 kg)   LMP 08/09/2018   BMI 45.28 kg/m    Physical Exam Vitals reviewed.  Constitutional:      Appearance: Normal appearance.  Cardiovascular:     Rate and Rhythm: Normal rate and regular rhythm.     Pulses: Normal pulses.     Heart sounds: Normal heart sounds.  Pulmonary:     Effort: Pulmonary effort is normal.     Breath sounds: Normal breath sounds.  Skin:    General: Skin is warm and dry.  Neurological:     Mental Status: She is alert and oriented to person, place, and time.  Psychiatric:        Mood and Affect: Mood normal.        Behavior: Behavior normal.        Thought Content: Thought content normal.        Judgment: Judgment normal.      No results found for any visits on 08/18/22.    The 10-year ASCVD risk score (Arnett DK, et al., 2019) is: 2.1%    Assessment & Plan:   Problem List Items Addressed This Visit       Other   Attention and concentration deficit - Primary    Stable on current dose.  Refills provided. PDMP reviewed. Follow-up in 3 months        Relevant Medications   lisdexamfetamine (VYVANSE) 70 MG capsule (Start on 09/13/2022)   lisdexamfetamine (VYVANSE) 70 MG capsule (Start on 10/13/2022)   lisdexamfetamine (VYVANSE) 70 MG  capsule (Start on 11/13/2022)    Return in about 3 months (around 11/18/2022) for routine f/u.    Terrilyn Saver, NP

## 2022-08-18 NOTE — Assessment & Plan Note (Signed)
Stable on current dose.  Refills provided. PDMP reviewed. Follow-up in 3 months

## 2022-09-07 ENCOUNTER — Encounter: Payer: Self-pay | Admitting: Family Medicine

## 2022-09-07 ENCOUNTER — Ambulatory Visit (INDEPENDENT_AMBULATORY_CARE_PROVIDER_SITE_OTHER): Payer: 59 | Admitting: Family Medicine

## 2022-09-07 VITALS — BP 121/82 | HR 88 | Temp 98.1°F | Resp 16 | Ht 64.0 in | Wt 262.4 lb

## 2022-09-07 DIAGNOSIS — H6591 Unspecified nonsuppurative otitis media, right ear: Secondary | ICD-10-CM | POA: Diagnosis not present

## 2022-09-07 DIAGNOSIS — Z6841 Body Mass Index (BMI) 40.0 and over, adult: Secondary | ICD-10-CM

## 2022-09-07 MED ORDER — WEGOVY 0.25 MG/0.5ML ~~LOC~~ SOAJ: 0.2500 mg | SUBCUTANEOUS | 3 refills | Status: DC

## 2022-09-07 MED ORDER — PREDNISONE 20 MG PO TABS: 40.0000 mg | ORAL_TABLET | Freq: Every day | ORAL | 0 refills | Status: AC

## 2022-09-07 NOTE — Progress Notes (Signed)
Acute Office Visit  Subjective:     Patient ID: Janet Glover, female    DOB: 1966/08/22, 56 y.o.   MRN: [id number]  Chief Complaint  Patient presents with   ear issue    HPI Patient is in today for ear issues.   Patient reports that in October she was on a cruise and got water in her ear. She took swimmers ear drops and that cleared it up. Now she is reporting a pressure, crackling sensation in the right ear. She hears popping with yawning and feels pressure/popping with position changes. She has not had any dizziness, fevers, pain, chills, hearing loss, or other URI symptoms. She called her ENT but they cannot see her until July.    ROS All review of systems negative except what is listed in the HPI      Objective:    BP 121/82   Pulse 88   Temp 98.1 F (36.7 C)   Resp 16   Ht '5\' 4"'$  (1.626 m)   Wt 262 lb 6.4 oz (119 kg)   LMP 08/09/2018   SpO2 96%   BMI 45.04 kg/m    Physical Exam Vitals reviewed.  Constitutional:      Appearance: Normal appearance.  HENT:     Head: Normocephalic.     Right Ear: Ear canal and external ear normal. A middle ear effusion is present. There is no impacted cerumen.     Left Ear: Tympanic membrane, ear canal and external ear normal. There is no impacted cerumen.  Neurological:     General: No focal deficit present.     Mental Status: She is alert and oriented to person, place, and time. Mental status is at baseline.  Psychiatric:        Mood and Affect: Mood normal.        Behavior: Behavior normal.        Thought Content: Thought content normal.        Judgment: Judgment normal.         No results found for any visits on 09/07/22.      Assessment & Plan:   Problem List Items Addressed This Visit       Other   Obesity - Primary Previously not approved for [facility] and out of stock. Reports now she has saved up money to pay whatever insurance won't cover. She would like me to resend to see if her pharmacy has any in  stock - aware of supply issues. 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     Relevant Medications   Semaglutide-Weight Management (WEGOVY) 0.25 MG/0.5ML SOAJ   Other Visit Diagnoses     Fluid level behind tympanic membrane of right ear     Let's do a short trial of prednisone and then continue Flonase. Keep ENT appointment so they can reassess if this does not work.    Relevant Medications   predniSONE (DELTASONE) 20 MG tablet       Meds ordered this encounter  Medications   predniSONE (DELTASONE) 20 MG tablet    Sig: Take 2 tablets (40 mg total) by mouth daily with breakfast for 5 days.    Dispense:  10 tablet    Refill:  0    Order Specific Question:   Supervising Provider    Answer:   Penni Homans A [[id number]]   Semaglutide-Weight Management (WEGOVY) 0.25 MG/0.5ML SOAJ    Sig: Inject 0.25 mg into the skin once a week. After 4 weeks, can  increase to 0.5 mg weekly for 4 weeks, then 1 mg weekly for 4 weeks    Dispense:  2 mL    Refill:  3    Order Specific Question:   Supervising Provider    Answer:   Penni Homans A [[id number]]    Return if symptoms worsen or fail to improve.  Terrilyn Saver, NP

## 2022-09-07 NOTE — Patient Instructions (Signed)
Let's do a short trial of prednisone and then continue Flonase. Keep ENT appointment so they can reassess if this does not work.

## 2022-09-14 ENCOUNTER — Telehealth: Payer: Self-pay | Admitting: Family Medicine

## 2022-09-14 DIAGNOSIS — R41840 Attention and concentration deficit: Secondary | ICD-10-CM

## 2022-09-14 NOTE — Telephone Encounter (Signed)
Pt called stating that her pharmacy is out of vyvanse and they are unsure when they will get more in. Pt needs Rx rerouted to the following pharmacy:  Prescription Request  09/14/2022  Is this a "Controlled Substance" medicine? Yes  LOV: 09/07/2022  What is the name of the medication or equipment?   lisdexamfetamine (VYVANSE) 70 MG capsule [[id number]]   Have you contacted your pharmacy to request a refill? No   Which pharmacy would you like this sent to?   [place] [facility] [place], [place],  [place]  [address] P: [phone]  Patient notified that their request is being sent to the clinical staff for review and that they should receive a response within 2 business days.   Please advise at Mobile [phone] (mobile)

## 2022-09-15 ENCOUNTER — Other Ambulatory Visit (HOSPITAL_BASED_OUTPATIENT_CLINIC_OR_DEPARTMENT_OTHER): Payer: Self-pay

## 2022-09-15 MED ORDER — LISDEXAMFETAMINE DIMESYLATE 70 MG PO CAPS
70.0000 mg | ORAL_CAPSULE | Freq: Every day | ORAL | 0 refills | Status: DC
  Filled 2022-09-15: qty 30, 30d supply, fill #0

## 2022-09-16 ENCOUNTER — Other Ambulatory Visit (HOSPITAL_BASED_OUTPATIENT_CLINIC_OR_DEPARTMENT_OTHER): Payer: Self-pay

## 2022-09-16 NOTE — Telephone Encounter (Signed)
[facility] stated [facility] had [facility].  Advised patient and she will pick up from there.  I also called [facility] and they did not fill rx.

## 2022-11-03 ENCOUNTER — Encounter (INDEPENDENT_AMBULATORY_CARE_PROVIDER_SITE_OTHER): Payer: 59 | Admitting: Family Medicine

## 2022-11-03 DIAGNOSIS — T753XXA Motion sickness, initial encounter: Secondary | ICD-10-CM | POA: Diagnosis not present

## 2022-11-03 MED ORDER — SCOPOLAMINE 1 MG/3DAYS TD PT72: 1.0000 | MEDICATED_PATCH | TRANSDERMAL | 0 refills | Status: DC

## 2022-11-03 NOTE — Telephone Encounter (Signed)

## 2022-12-14 ENCOUNTER — Telehealth: Payer: Self-pay | Admitting: Family Medicine

## 2022-12-14 DIAGNOSIS — I10 Essential (primary) hypertension: Secondary | ICD-10-CM

## 2022-12-14 DIAGNOSIS — F909 Attention-deficit hyperactivity disorder, unspecified type: Secondary | ICD-10-CM

## 2022-12-14 DIAGNOSIS — Z79899 Other long term (current) drug therapy: Secondary | ICD-10-CM

## 2022-12-14 DIAGNOSIS — E559 Vitamin D deficiency, unspecified: Secondary | ICD-10-CM

## 2022-12-14 NOTE — Addendum Note (Signed)
Addended by: Caleen Jobs B on: 12/14/2022 09:26 PM   Modules accepted: Orders

## 2022-12-14 NOTE — Telephone Encounter (Signed)
We will be happy to get pt to come in but what lab orders would you like?

## 2022-12-14 NOTE — Telephone Encounter (Signed)
Patient wants to know if she can come in earlier on 3/25 to get labs drawn for her medication refill. Please place orders and call when placed so she can get scheduled

## 2022-12-15 NOTE — Telephone Encounter (Signed)
Patient will be here at 715 for labs to be drawn

## 2022-12-19 NOTE — Progress Notes (Unsigned)
   Established Patient Office Visit  Subjective   Patient ID: Janet Glover, female    DOB: 06/27/1966  Age: 57 y.o. MRN: [id number]  No chief complaint on file.   HPI  Patient is here for routine 2-month follow-up. She just recently got back from a cruise ***.  ADHD/ADD: - Medications: Vyvanse 70 mg daily - Compliance - Side effects: - Concerns: - Contract: - UDS: today   Mood follow-up: - Diagnosis: depression - Treatment: Prozac 10 mg daily - Medication side effects:  - SI/HI:  - Update: ***phq/gad  Hypertension: - Medications: losartan-hctz 100-25 mg daily - Compliance: *** - Checking BP at home: *** - Denies any SOB, recurrent headaches, CP, vision changes, LE edema, dizziness, palpitations, or medication side effects. - Diet: *** - Exercise: *** - Stressors:  Obesity: - Medications: Wegovy 0.25 mg/week *** - Diet: - Exercise: - *** Wt Readings from Last 3 Encounters:  09/07/22 262 lb 6.4 oz (119 kg)  08/18/22 263 lb 12.8 oz (119.7 kg)  06/01/22 256 lb (116.1 kg)   Vitamin D deficiency: - Supplementation: none currently ***      {History (Optional):23778}  ROS    Objective:     LMP 08/09/2018  {Vitals History (Optional):23777}  Physical Exam   No results found for any visits on 12/20/22.  {Labs (Optional):23779}  The 10-year ASCVD risk score (Arnett DK, et al., 2019) is: 2%    Assessment & Plan:   Problem List Items Addressed This Visit   None   No follow-ups on file.    Terrilyn Saver, NP

## 2022-12-20 ENCOUNTER — Encounter: Payer: Self-pay | Admitting: Family Medicine

## 2022-12-20 ENCOUNTER — Ambulatory Visit (INDEPENDENT_AMBULATORY_CARE_PROVIDER_SITE_OTHER): Payer: BC Managed Care – PPO | Admitting: Family Medicine

## 2022-12-20 DIAGNOSIS — I10 Essential (primary) hypertension: Secondary | ICD-10-CM

## 2022-12-20 DIAGNOSIS — Z6841 Body Mass Index (BMI) 40.0 and over, adult: Secondary | ICD-10-CM | POA: Diagnosis not present

## 2022-12-20 DIAGNOSIS — R41840 Attention and concentration deficit: Secondary | ICD-10-CM

## 2022-12-20 MED ORDER — LOSARTAN POTASSIUM-HCTZ 100-25 MG PO TABS: 1.0000 | ORAL_TABLET | Freq: Every morning | ORAL | 1 refills | Status: DC

## 2022-12-20 MED ORDER — WEGOVY 0.25 MG/0.5ML ~~LOC~~ SOAJ: 0.2500 mg | SUBCUTANEOUS | 3 refills | Status: DC

## 2022-12-20 MED ORDER — LISDEXAMFETAMINE DIMESYLATE 70 MG PO CAPS: 70.0000 mg | ORAL_CAPSULE | Freq: Every day | ORAL | 0 refills | Status: DC

## 2022-12-20 NOTE — Assessment & Plan Note (Signed)
Stable on current dose.  Refills provided. PDMP reviewed. Follow-up in 3 months   Indication for controlled substance: ADHD Medication and dose: Vyvanse 70 mg daily # pills per month: 30 Last UDS date: ordered for 12/20/22 Controlled Substance Treatment Agreement signed (Y/N): yes, 12/20/22

## 2022-12-20 NOTE — Assessment & Plan Note (Signed)
Continue lifestyle measures - healthy diet, portion control, regular exercise (rides [employer] bike most days) Will try to get [facility] again. Prescription sent.

## 2022-12-20 NOTE — Assessment & Plan Note (Signed)
Blood pressure is at goal for age and co-morbidities.   Recommendations: continue lisinopril - hctz 100-25 mg daily - BP goal <130/80 - monitor and log blood pressures at home - check around the same time each day in a relaxed setting - Limit salt to <2000 mg/day - Follow DASH eating plan (heart healthy diet) - limit alcohol to 2 standard drinks per day for men and 1 per day for women - avoid tobacco products - get at least 2 hours of regular aerobic exercise weekly Patient aware of signs/symptoms requiring further/urgent evaluation. Future labs ordered previously - to schedule appt. She wants to come fasting.

## 2022-12-20 NOTE — Patient Instructions (Signed)
Schedule your future lab appointment

## 2022-12-21 ENCOUNTER — Other Ambulatory Visit (INDEPENDENT_AMBULATORY_CARE_PROVIDER_SITE_OTHER): Payer: BC Managed Care – PPO

## 2022-12-21 DIAGNOSIS — F909 Attention-deficit hyperactivity disorder, unspecified type: Secondary | ICD-10-CM

## 2022-12-21 DIAGNOSIS — I10 Essential (primary) hypertension: Secondary | ICD-10-CM

## 2022-12-21 DIAGNOSIS — Z6841 Body Mass Index (BMI) 40.0 and over, adult: Secondary | ICD-10-CM

## 2022-12-21 DIAGNOSIS — E559 Vitamin D deficiency, unspecified: Secondary | ICD-10-CM | POA: Diagnosis not present

## 2022-12-21 DIAGNOSIS — Z79899 Other long term (current) drug therapy: Secondary | ICD-10-CM

## 2022-12-21 LAB — CBC WITH DIFFERENTIAL/PLATELET
Basophils Absolute: 0 10*3/uL (ref 0.0–0.1)
Basophils Relative: 0.4 % (ref 0.0–3.0)
Eosinophils Absolute: 0.2 10*3/uL (ref 0.0–0.7)
Eosinophils Relative: 3.4 % (ref 0.0–5.0)
HCT: 41.9 % (ref 36.0–46.0)
Hemoglobin: 13.6 g/dL (ref 12.0–15.0)
Lymphocytes Relative: 24.7 % (ref 12.0–46.0)
Lymphs Abs: 1.6 10*3/uL (ref 0.7–4.0)
MCHC: 32.3 g/dL (ref 30.0–36.0)
MCV: 81.2 fl (ref 78.0–100.0)
Monocytes Absolute: 0.7 10*3/uL (ref 0.1–1.0)
Monocytes Relative: 10.4 % (ref 3.0–12.0)
Neutro Abs: 4 10*3/uL (ref 1.4–7.7)
Neutrophils Relative %: 61.1 % (ref 43.0–77.0)
Platelets: 257 10*3/uL (ref 150.0–400.0)
RBC: 5.17 Mil/uL — ABNORMAL HIGH (ref 3.87–5.11)
RDW: 13.2 % (ref 11.5–15.5)
WBC: 6.6 10*3/uL (ref 4.0–10.5)

## 2022-12-21 LAB — COMPREHENSIVE METABOLIC PANEL
ALT: 14 U/L (ref 0–35)
AST: 17 U/L (ref 0–37)
Albumin: 4.3 g/dL (ref 3.5–5.2)
Alkaline Phosphatase: 63 U/L (ref 39–117)
BUN: 17 mg/dL (ref 6–23)
CO2: 29 mEq/L (ref 19–32)
Calcium: 9.6 mg/dL (ref 8.4–10.5)
Chloride: 101 mEq/L (ref 96–112)
Creatinine, Ser: 0.89 mg/dL (ref 0.40–1.20)
GFR: 72.92 mL/min (ref >60.00)
Glucose, Bld: 101 mg/dL — ABNORMAL HIGH (ref 70–99)
Potassium: 4.5 mEq/L (ref 3.5–5.1)
Sodium: 138 mEq/L (ref 135–145)
Total Bilirubin: 0.4 mg/dL (ref 0.2–1.2)
Total Protein: 6.8 g/dL (ref 6.0–8.3)

## 2022-12-21 LAB — LIPID PANEL
Cholesterol: 220 mg/dL — ABNORMAL HIGH (ref 0–200)
HDL: 79.6 mg/dL (ref >39.00)
LDL Cholesterol: 122 mg/dL — ABNORMAL HIGH (ref 0–99)
NonHDL: 140.23
Total CHOL/HDL Ratio: 3
Triglycerides: 93 mg/dL (ref 0.0–149.0)
VLDL: 18.6 mg/dL (ref 0.0–40.0)

## 2022-12-21 LAB — TSH: TSH: 2.28 u[IU]/mL (ref 0.35–5.50)

## 2022-12-21 LAB — HEMOGLOBIN A1C: Hgb A1c MFr Bld: 5.9 % (ref 4.6–6.5)

## 2022-12-21 LAB — VITAMIN D 25 HYDROXY (VIT D DEFICIENCY, FRACTURES): VITD: 14.59 ng/mL — ABNORMAL LOW (ref 30.00–100.00)

## 2022-12-23 ENCOUNTER — Ambulatory Visit: Payer: 59 | Admitting: Family Medicine

## 2022-12-23 LAB — DRUG MONITORING, PANEL 8 WITH CONFIRMATION, URINE
6 Acetylmorphine: NEGATIVE ng/mL (ref <10)
Alcohol Metabolites: NEGATIVE ng/mL (ref <500)
Amphetamine: 6888 ng/mL — ABNORMAL HIGH (ref <250)
Amphetamines: POSITIVE ng/mL — AB (ref <500)
Benzodiazepines: NEGATIVE ng/mL (ref <100)
Buprenorphine, Urine: NEGATIVE ng/mL (ref <5)
Cocaine Metabolite: NEGATIVE ng/mL (ref <150)
Creatinine: 112.5 mg/dL (ref >=20.0)
MDMA: NEGATIVE ng/mL (ref <500)
Marijuana Metabolite: NEGATIVE ng/mL (ref <20)
Methamphetamine: NEGATIVE ng/mL (ref <250)
Opiates: NEGATIVE ng/mL (ref <100)
Oxidant: NEGATIVE ug/mL (ref <200)
Oxycodone: NEGATIVE ng/mL (ref <100)
pH: 7.4 (ref 4.5–9.0)

## 2022-12-23 LAB — DM TEMPLATE

## 2022-12-23 MED ORDER — VITAMIN D (ERGOCALCIFEROL) 1.25 MG (50000 UNIT) PO CAPS: 50000.0000 [IU] | ORAL_CAPSULE | ORAL | 0 refills | Status: DC

## 2022-12-23 NOTE — Addendum Note (Signed)
Addended by: Caleen Jobs B on: 12/23/2022 07:40 AM   Modules accepted: Orders

## 2022-12-27 ENCOUNTER — Encounter: Payer: Self-pay | Admitting: Family Medicine

## 2023-01-01 ENCOUNTER — Encounter: Payer: Self-pay | Admitting: Family Medicine

## 2023-01-03 MED ORDER — WEGOVY 0.5 MG/0.5ML ~~LOC~~ SOAJ: 0.5000 mg | SUBCUTANEOUS | 0 refills | Status: DC

## 2023-01-03 NOTE — Telephone Encounter (Signed)
Pended the 0.5 mg dosage.

## 2023-01-21 DIAGNOSIS — E7800 Pure hypercholesterolemia, unspecified: Secondary | ICD-10-CM | POA: Insufficient documentation

## 2023-03-21 ENCOUNTER — Encounter: Payer: Self-pay | Admitting: Family Medicine

## 2023-03-21 ENCOUNTER — Ambulatory Visit (INDEPENDENT_AMBULATORY_CARE_PROVIDER_SITE_OTHER): Payer: BC Managed Care – PPO | Admitting: Family Medicine

## 2023-03-21 VITALS — BP 114/51 | HR 72 | Ht 64.0 in | Wt 238.0 lb

## 2023-03-21 DIAGNOSIS — F32A Depression, unspecified: Secondary | ICD-10-CM

## 2023-03-21 DIAGNOSIS — E66813 Obesity, class 3: Secondary | ICD-10-CM

## 2023-03-21 DIAGNOSIS — E559 Vitamin D deficiency, unspecified: Secondary | ICD-10-CM | POA: Diagnosis not present

## 2023-03-21 DIAGNOSIS — I10 Essential (primary) hypertension: Secondary | ICD-10-CM | POA: Diagnosis not present

## 2023-03-21 DIAGNOSIS — R41840 Attention and concentration deficit: Secondary | ICD-10-CM | POA: Diagnosis not present

## 2023-03-21 DIAGNOSIS — Z6841 Body Mass Index (BMI) 40.0 and over, adult: Secondary | ICD-10-CM

## 2023-03-21 MED ORDER — LISDEXAMFETAMINE DIMESYLATE 70 MG PO CAPS: 70.0000 mg | ORAL_CAPSULE | Freq: Every day | ORAL | 0 refills | Status: DC

## 2023-03-21 MED ORDER — MUPIROCIN 2 % EX OINT
TOPICAL_OINTMENT | CUTANEOUS | 3 refills | Status: DC
Start: 2023-03-21 — End: 2023-11-28

## 2023-03-21 NOTE — Assessment & Plan Note (Signed)
Doing well without medication  No SI/HI

## 2023-03-21 NOTE — Assessment & Plan Note (Signed)
Recently finished high dose supplementation Repeat labs

## 2023-03-21 NOTE — Assessment & Plan Note (Signed)
Blood pressure is at goal for age and co-morbidities.   Recommendations: continue lisinopril - hctz 100-25 mg daily - BP goal <130/80 - monitor and log blood pressures at home - check around the same time each day in a relaxed setting - Limit salt to <2000 mg/day - Follow DASH eating plan (heart healthy diet) - limit alcohol to 2 standard drinks per day for men and 1 per day for women - avoid tobacco products - get at least 2 hours of regular aerobic exercise weekly Patient aware of signs/symptoms requiring further/urgent evaluation. Requesting to recheck lipids fasting

## 2023-03-21 NOTE — Assessment & Plan Note (Signed)
Stable on current dose.  Refills provided. PDMP reviewed. Follow-up in 3 months   Indication for controlled substance: ADHD Medication and dose: Vyvanse 70 mg daily # pills per month: 30 Last UDS date: ordered for 12/20/22 Controlled Substance Treatment Agreement signed (Y/N): yes, 12/20/22  

## 2023-03-21 NOTE — Progress Notes (Signed)
Established Patient Office Visit  Subjective   Patient ID: Janet Glover, female    DOB: [birth year]  Age: 57 y.o. MRN: [id number]  Chief Complaint  Patient presents with   Medical Management of Chronic Issues   ADHD    HPI  Patient is here for routine 25-month follow-up. She just recently got back from a cruise to the [place] with her husband and friends.   ADHD/ADD: - Medications: Vyvanse 70 mg daily - Compliance good - Side effects: no - Concerns:none - Contract: UTD - UDS: UTD  Mood follow-up: - Diagnosis: depression - Treatment: none currently - Medication side effects: n/a - SI/HI: no - Update: doing well overall, prefers to stay off of medication     12/20/2022    4:14 PM 12/23/2021    2:50 PM  PHQ9 SCORE ONLY  PHQ-9 Total Score 0 0      12/20/2022    4:14 PM  GAD 7 : Generalized Anxiety Score  Nervous, Anxious, on Edge 0  Control/stop worrying 0  Worry too much - different things 0  Trouble relaxing 0  Restless 0  Easily annoyed or irritable 0  Afraid - awful might happen 0  Total GAD 7 Score 0  Anxiety Difficulty Not difficult at all     Hypertension: - Medications: losartan-hctz 100-25 mg daily - Compliance: good - Checking BP at home: no - Denies any SOB, recurrent headaches, CP, vision changes, LE edema, dizziness, palpitations, or medication side effects.  Obesity: - Medications: Wegovy 1 mg/week - just finished week 4 and tolerated well other than some occasional constipation; ready to go up to next dose - Diet: general, trying to work on portion control - Exercise: regularly   Wt Readings from Last 3 Encounters:  03/21/23 238 lb (108 kg)  12/20/22 263 lb (119.3 kg)  09/07/22 262 lb 6.4 oz (119 kg)   Vitamin D deficiency: - Supplementation: recently did 8 weeks of high dose followed by daily supplementation. Recheck labs ordered. She is interested in continuing the high dose weekly supplement instead of daily dose pending labs.          ROS All review of systems negative except what is listed in the HPI    Objective:     BP (!) 114/51   Pulse 72   Ht 5\' 4"  (1.626 m)   Wt 238 lb (108 kg)   LMP 08/09/2018   SpO2 100%   BMI 40.85 kg/m    Physical Exam Vitals reviewed.  Constitutional:      Appearance: Normal appearance.  Cardiovascular:     Rate and Rhythm: Normal rate and regular rhythm.     Pulses: Normal pulses.     Heart sounds: Normal heart sounds.  Pulmonary:     Effort: Pulmonary effort is normal.     Breath sounds: Normal breath sounds.  Skin:    General: Skin is warm and dry.  Neurological:     Mental Status: She is alert and oriented to person, place, and time.  Psychiatric:        Mood and Affect: Mood normal.        Behavior: Behavior normal.        Thought Content: Thought content normal.        Judgment: Judgment normal.      No results found for any visits on 03/21/23.    The 10-year ASCVD risk score (Arnett DK, et al., 2019) is: 1.7%    Assessment & Plan:  Problem List Items Addressed This Visit     Depression    Doing well without medication  No SI/HI      Obesity - Primary (Chronic)    Continue current lifestyle measures  -healthy diet, regular exercise, portion control Getting good response with Wegovy - increasing to 1.7 mg dose this week.       Relevant Medications   WEGOVY 1.7 MG/0.75ML SOAJ   lisdexamfetamine (VYVANSE) 70 MG capsule (Start on 05/19/2023)   lisdexamfetamine (VYVANSE) 70 MG capsule (Start on 04/19/2023)   lisdexamfetamine (VYVANSE) 70 MG capsule   Other Relevant Orders   Lipid panel   Vitamin D deficiency (Chronic)    Recently finished high dose supplementation Repeat labs      Attention and concentration deficit (Chronic)    Stable on current dose.  Refills provided. PDMP reviewed. Follow-up in 3 months   Indication for controlled substance: ADHD Medication and dose: Vyvanse 70 mg daily # pills per month: 30 Last UDS  date: ordered for 12/20/22 Controlled Substance Treatment Agreement signed (Y/N): yes, 12/20/22       Relevant Medications   lisdexamfetamine (VYVANSE) 70 MG capsule (Start on 05/19/2023)   lisdexamfetamine (VYVANSE) 70 MG capsule (Start on 04/19/2023)   lisdexamfetamine (VYVANSE) 70 MG capsule   Essential hypertension, benign (Chronic)    Blood pressure is at goal for age and co-morbidities.   Recommendations: continue lisinopril - hctz 100-25 mg daily - BP goal <130/80 - monitor and log blood pressures at home - check around the same time each day in a relaxed setting - Limit salt to <2000 mg/day - Follow DASH eating plan (heart healthy diet) - limit alcohol to 2 standard drinks per day for men and 1 per day for women - avoid tobacco products - get at least 2 hours of regular aerobic exercise weekly Patient aware of signs/symptoms requiring further/urgent evaluation. Requesting to recheck lipids fasting        Future labs ordered previously; she prefers to schedule a fasting appointment.    Return in about 3 months (around 06/21/2023) for routine follow-up.    Clayborne Dana, NP

## 2023-03-21 NOTE — Assessment & Plan Note (Signed)
Continue current lifestyle measures  -healthy diet, regular exercise, portion control Getting good response with Wegovy - increasing to 1.7 mg dose this week.

## 2023-03-28 ENCOUNTER — Other Ambulatory Visit: Payer: BC Managed Care – PPO

## 2023-03-30 ENCOUNTER — Other Ambulatory Visit (INDEPENDENT_AMBULATORY_CARE_PROVIDER_SITE_OTHER): Payer: BC Managed Care – PPO

## 2023-03-30 ENCOUNTER — Other Ambulatory Visit (HOSPITAL_BASED_OUTPATIENT_CLINIC_OR_DEPARTMENT_OTHER): Payer: Self-pay | Admitting: Family Medicine

## 2023-03-30 DIAGNOSIS — E559 Vitamin D deficiency, unspecified: Secondary | ICD-10-CM

## 2023-03-30 DIAGNOSIS — Z6841 Body Mass Index (BMI) 40.0 and over, adult: Secondary | ICD-10-CM | POA: Diagnosis not present

## 2023-03-30 DIAGNOSIS — Z1231 Encounter for screening mammogram for malignant neoplasm of breast: Secondary | ICD-10-CM

## 2023-03-30 LAB — LIPID PANEL
Cholesterol: 192 mg/dL (ref 0–200)
HDL: 62.2 mg/dL
LDL Cholesterol: 110 mg/dL — ABNORMAL HIGH (ref 0–99)
NonHDL: 130.06
Total CHOL/HDL Ratio: 3
Triglycerides: 101 mg/dL (ref 0.0–149.0)
VLDL: 20.2 mg/dL (ref 0.0–40.0)

## 2023-03-30 LAB — VITAMIN D 25 HYDROXY (VIT D DEFICIENCY, FRACTURES): VITD: 23.55 ng/mL — ABNORMAL LOW (ref 30.00–100.00)

## 2023-03-30 MED ORDER — VITAMIN D (ERGOCALCIFEROL) 1.25 MG (50000 UNIT) PO CAPS
50000.0000 [IU] | ORAL_CAPSULE | ORAL | 3 refills | Status: DC
Start: 2023-03-30 — End: 2024-05-29

## 2023-03-30 NOTE — Addendum Note (Signed)
Addended by: Hyman Hopes B on: 03/30/2023 04:49 PM   Modules accepted: Orders

## 2023-04-05 ENCOUNTER — Ambulatory Visit (HOSPITAL_BASED_OUTPATIENT_CLINIC_OR_DEPARTMENT_OTHER)
Admission: RE | Admit: 2023-04-05 | Discharge: 2023-04-05 | Disposition: A | Discharge status: Home / Self Care | Payer: BC Managed Care – PPO | Source: Ambulatory Visit | Attending: Family Medicine | Admitting: Family Medicine

## 2023-04-05 ENCOUNTER — Encounter (HOSPITAL_BASED_OUTPATIENT_CLINIC_OR_DEPARTMENT_OTHER): Payer: Self-pay

## 2023-04-05 DIAGNOSIS — Z1231 Encounter for screening mammogram for malignant neoplasm of breast: Secondary | ICD-10-CM | POA: Diagnosis present

## 2023-04-07 ENCOUNTER — Other Ambulatory Visit: Payer: Self-pay | Admitting: Family Medicine

## 2023-04-07 DIAGNOSIS — R928 Other abnormal and inconclusive findings on diagnostic imaging of breast: Secondary | ICD-10-CM

## 2023-04-12 ENCOUNTER — Ambulatory Visit
Admission: RE | Admit: 2023-04-12 | Discharge: 2023-04-12 | Disposition: A | Discharge status: Home / Self Care | Payer: BC Managed Care – PPO | Source: Ambulatory Visit | Attending: Family Medicine | Admitting: Family Medicine

## 2023-04-12 ENCOUNTER — Ambulatory Visit: Payer: BC Managed Care – PPO

## 2023-04-12 DIAGNOSIS — R928 Other abnormal and inconclusive findings on diagnostic imaging of breast: Secondary | ICD-10-CM

## 2023-06-01 ENCOUNTER — Encounter: Payer: Self-pay | Admitting: Family Medicine

## 2023-06-01 DIAGNOSIS — R41840 Attention and concentration deficit: Secondary | ICD-10-CM

## 2023-06-02 NOTE — Progress Notes (Signed)
Established Patient Office Visit  Subjective   Patient ID: Janet Glover, female    DOB: 1966-09-20  Age: 57 y.o. MRN: [id number]  Chief Complaint  Patient presents with   Medical Management of Chronic Issues    HPI    Patient is here for routine 59-month follow-up. She is requesting scopolamine patches for an upcoming cruise.    ADHD/ADD: - Medications: Vyvanse 70 mg daily - Compliance good - Side effects: no - Concerns:none - Contract: UTD - UDS: UTD   Hypertension: - Medications: losartan-hctz 100-25 mg daily - Compliance: good - Checking BP at home: no - Denies any SOB, recurrent headaches, CP, vision changes, LE edema, dizziness, palpitations, or medication side effects.  Obesity: - Medications: Wegovy 2.4 mg/week  - Diet: general, trying to work on portion control - Exercise: regularly   Wt Readings from Last 3 Encounters:  06/03/23 226 lb (102.5 kg)  03/21/23 238 lb (108 kg)  12/20/22 263 lb (119.3 kg)   Vitamin D deficiency: - Supplementation: high dose weekly supplement. Recheck labs ordered.         ROS All review of systems negative except what is listed in the HPI    Objective:     BP 131/65   Pulse 83   Ht 5\' 4"  (1.626 m)   Wt 226 lb (102.5 kg)   LMP 08/09/2018   SpO2 99%   BMI 38.79 kg/m    Physical Exam Vitals reviewed.  Constitutional:      Appearance: Normal appearance.  Cardiovascular:     Rate and Rhythm: Normal rate and regular rhythm.     Pulses: Normal pulses.     Heart sounds: Normal heart sounds.  Pulmonary:     Effort: Pulmonary effort is normal.     Breath sounds: Normal breath sounds.  Skin:    General: Skin is warm and dry.  Neurological:     Mental Status: She is alert and oriented to person, place, and time.  Psychiatric:        Mood and Affect: Mood normal.        Behavior: Behavior normal.        Thought Content: Thought content normal.        Judgment: Judgment normal.      No results found for  any visits on 06/03/23.    The 10-year ASCVD risk score (Arnett DK, et al., 2019) is: 2.7%    Assessment & Plan:   Problem List Items Addressed This Visit       Active Problems   Obesity - Primary (Chronic)    Continue current lifestyle measures  -healthy diet, regular exercise, portion control Getting good response with Wegovy 2.4 mg      Relevant Medications   WEGOVY 2.4 MG/0.75ML SOAJ   Other Relevant Orders   Hemoglobin A1c   Comprehensive metabolic panel   Vitamin D deficiency (Chronic)    Continue high dose weekly supplement Labs today      Relevant Orders   VITAMIN D 25 Hydroxy (Vit-D Deficiency, Fractures)   Attention and concentration deficit (Chronic)    Stable on current dose.  She should have one more refill at the pharmacy - she will go over there to check and have them call us if there is a problem Follow-up in 3 months   Indication for controlled substance: ADHD Medication and dose: Vyvanse 70 mg daily # pills per month: 30 Last UDS date: ordered for 12/20/22 Controlled Substance Treatment Agreement signed (Y/N):  yes, 12/20/22       Essential hypertension, benign (Chronic)    Blood pressure is at goal for age and co-morbidities.   Recommendations: continue lisinopril - hctz 100-25 mg daily - BP goal <130/80 - monitor and log blood pressures at home - check around the same time each day in a relaxed setting - Limit salt to <2000 mg/day - Follow DASH eating plan (heart healthy diet) - limit alcohol to 2 standard drinks per day for men and 1 per day for women - avoid tobacco products - get at least 2 hours of regular aerobic exercise weekly Patient aware of signs/symptoms requiring further/urgent evaluation.       Relevant Orders   TSH   Lipid panel   Comprehensive metabolic panel   [place]    Patient with upcoming cruise She is requesting scopolamine patches due to hx of seasickness       Relevant Medications   scopolamine  (TRANSDERM-SCOP) 1 MG/3DAYS     Return in about 3 months (around 09/02/2023) for routine follow-up (schedule lab appointment approximately 1 week from now).    Clayborne Dana, NP

## 2023-06-03 ENCOUNTER — Ambulatory Visit (INDEPENDENT_AMBULATORY_CARE_PROVIDER_SITE_OTHER): Payer: BC Managed Care – PPO | Admitting: Family Medicine

## 2023-06-03 ENCOUNTER — Encounter: Payer: Self-pay | Admitting: Family Medicine

## 2023-06-03 VITALS — BP 131/65 | HR 83 | Ht 64.0 in | Wt 226.0 lb

## 2023-06-03 DIAGNOSIS — E66813 Obesity, class 3: Secondary | ICD-10-CM

## 2023-06-03 DIAGNOSIS — E559 Vitamin D deficiency, unspecified: Secondary | ICD-10-CM

## 2023-06-03 DIAGNOSIS — T753XXA Motion sickness, initial encounter: Secondary | ICD-10-CM

## 2023-06-03 DIAGNOSIS — Z6841 Body Mass Index (BMI) 40.0 and over, adult: Secondary | ICD-10-CM

## 2023-06-03 DIAGNOSIS — I10 Essential (primary) hypertension: Secondary | ICD-10-CM

## 2023-06-03 DIAGNOSIS — R41840 Attention and concentration deficit: Secondary | ICD-10-CM | POA: Diagnosis not present

## 2023-06-03 MED ORDER — SCOPOLAMINE 1 MG/3DAYS TD PT72
1.0000 | MEDICATED_PATCH | TRANSDERMAL | 0 refills | Status: DC
Start: 2023-06-03 — End: 2023-11-28

## 2023-06-03 NOTE — Assessment & Plan Note (Signed)
Patient with upcoming cruise She is requesting scopolamine patches due to hx of seasickness

## 2023-06-03 NOTE — Assessment & Plan Note (Signed)
Continue current lifestyle measures  -healthy diet, regular exercise, portion control Getting good response with Wegovy 2.4 mg

## 2023-06-03 NOTE — Assessment & Plan Note (Signed)
Blood pressure is at goal for age and co-morbidities.   Recommendations: continue lisinopril - hctz 100-25 mg daily - BP goal <130/80 - monitor and log blood pressures at home - check around the same time each day in a relaxed setting - Limit salt to <2000 mg/day - Follow DASH eating plan (heart healthy diet) - limit alcohol to 2 standard drinks per day for men and 1 per day for women - avoid tobacco products - get at least 2 hours of regular aerobic exercise weekly Patient aware of signs/symptoms requiring further/urgent evaluation.

## 2023-06-03 NOTE — Assessment & Plan Note (Signed)
Continue high dose weekly supplement Labs today

## 2023-06-03 NOTE — Assessment & Plan Note (Signed)
Stable on current dose.  She should have one more refill at the pharmacy - she will go over there to check and have them call us if there is a problem Follow-up in 3 months   Indication for controlled substance: ADHD Medication and dose: Vyvanse 70 mg daily # pills per month: 30 Last UDS date: ordered for 12/20/22 Controlled Substance Treatment Agreement signed (Y/N): yes, 12/20/22

## 2023-06-09 ENCOUNTER — Other Ambulatory Visit (HOSPITAL_BASED_OUTPATIENT_CLINIC_OR_DEPARTMENT_OTHER): Payer: Self-pay

## 2023-06-09 ENCOUNTER — Other Ambulatory Visit: Payer: BC Managed Care – PPO

## 2023-06-09 ENCOUNTER — Other Ambulatory Visit (INDEPENDENT_AMBULATORY_CARE_PROVIDER_SITE_OTHER): Payer: BC Managed Care – PPO

## 2023-06-09 DIAGNOSIS — I10 Essential (primary) hypertension: Secondary | ICD-10-CM

## 2023-06-09 DIAGNOSIS — Z6841 Body Mass Index (BMI) 40.0 and over, adult: Secondary | ICD-10-CM | POA: Diagnosis not present

## 2023-06-09 DIAGNOSIS — E559 Vitamin D deficiency, unspecified: Secondary | ICD-10-CM | POA: Diagnosis not present

## 2023-06-09 LAB — COMPREHENSIVE METABOLIC PANEL
ALT: 10 U/L (ref 0–35)
AST: 14 U/L (ref 0–37)
Albumin: 4.1 g/dL (ref 3.5–5.2)
Alkaline Phosphatase: 60 U/L (ref 39–117)
BUN: 17 mg/dL (ref 6–23)
CO2: 28 meq/L (ref 19–32)
Calcium: 9.3 mg/dL (ref 8.4–10.5)
Chloride: 99 meq/L (ref 96–112)
Creatinine, Ser: 0.85 mg/dL (ref 0.40–1.20)
GFR: 76.8 mL/min (ref >60.00)
Glucose, Bld: 84 mg/dL (ref 70–99)
Potassium: 4.1 meq/L (ref 3.5–5.1)
Sodium: 138 meq/L (ref 135–145)
Total Bilirubin: 0.6 mg/dL (ref 0.2–1.2)
Total Protein: 6.5 g/dL (ref 6.0–8.3)

## 2023-06-09 LAB — LIPID PANEL
Cholesterol: 201 mg/dL — ABNORMAL HIGH (ref 0–200)
HDL: 70.2 mg/dL (ref >39.00)
LDL Cholesterol: 111 mg/dL — ABNORMAL HIGH (ref 0–99)
NonHDL: 130.64
Total CHOL/HDL Ratio: 3
Triglycerides: 96 mg/dL (ref 0.0–149.0)
VLDL: 19.2 mg/dL (ref 0.0–40.0)

## 2023-06-09 LAB — TSH: TSH: 1.8 u[IU]/mL (ref 0.35–5.50)

## 2023-06-09 LAB — VITAMIN D 25 HYDROXY (VIT D DEFICIENCY, FRACTURES): VITD: 43.87 ng/mL (ref 30.00–100.00)

## 2023-06-09 LAB — HEMOGLOBIN A1C: Hgb A1c MFr Bld: 5.5 % (ref 4.6–6.5)

## 2023-06-09 MED ORDER — FLULAVAL 0.5 ML IM SUSY
0.5000 mL | PREFILLED_SYRINGE | Freq: Once | INTRAMUSCULAR | 0 refills | Status: AC
  Filled 2023-06-09: qty 0.5, 1d supply, fill #0

## 2023-06-09 MED ORDER — COMIRNATY 30 MCG/0.3ML IM SUSY
0.3000 mL | PREFILLED_SYRINGE | Freq: Once | INTRAMUSCULAR | 0 refills | Status: AC
  Filled 2023-06-09: qty 0.3, 1d supply, fill #0

## 2023-06-10 NOTE — Progress Notes (Signed)
Vitamin D is back in normal range. Continue current supplement Cholesterol is mildly elevated, continue focusing on lifestyle measures for now  Lifestyle factors for lowering cholesterol include: Diet therapy - heart-healthy diet rich in fruits, veggies, fiber-rich whole grains, lean meats, chicken, fish (at least twice a week), fat-free or 1% dairy products; foods low in saturated/trans fats, cholesterol, sodium, and sugar. Mediterranean diet has shown to be very heart healthy. Regular exercise - recommend at least 30 minutes a day, 5 times per week Weight management

## 2023-06-29 MED ORDER — LISDEXAMFETAMINE DIMESYLATE 70 MG PO CAPS
70.0000 mg | ORAL_CAPSULE | Freq: Every day | ORAL | 0 refills | Status: DC
Start: 2023-07-29 — End: 2023-09-19

## 2023-06-29 MED ORDER — LISDEXAMFETAMINE DIMESYLATE 70 MG PO CAPS
70.0000 mg | ORAL_CAPSULE | Freq: Every day | ORAL | 0 refills | Status: DC
Start: 2023-06-29 — End: 2023-09-19

## 2023-06-29 MED ORDER — LISDEXAMFETAMINE DIMESYLATE 70 MG PO CAPS
70.0000 mg | ORAL_CAPSULE | Freq: Every day | ORAL | 0 refills | Status: DC
Start: 2023-08-27 — End: 2023-09-19

## 2023-06-29 NOTE — Telephone Encounter (Signed)
Last sent in August. Seen in September. Pended for next 3 months.

## 2023-06-29 NOTE — Addendum Note (Signed)
Addended bySilvio Pate on: 06/29/2023 08:19 AM   Modules accepted: Orders

## 2023-09-19 ENCOUNTER — Encounter: Payer: Self-pay | Admitting: Family Medicine

## 2023-09-19 ENCOUNTER — Telehealth: Payer: BC Managed Care – PPO | Admitting: Family Medicine

## 2023-09-19 DIAGNOSIS — E669 Obesity, unspecified: Secondary | ICD-10-CM

## 2023-09-19 DIAGNOSIS — R41840 Attention and concentration deficit: Secondary | ICD-10-CM | POA: Diagnosis not present

## 2023-09-19 MED ORDER — LISDEXAMFETAMINE DIMESYLATE 70 MG PO CAPS
70.0000 mg | ORAL_CAPSULE | Freq: Every day | ORAL | 0 refills | Status: DC
Start: 2023-11-18 — End: 2024-03-01

## 2023-09-19 MED ORDER — LISDEXAMFETAMINE DIMESYLATE 70 MG PO CAPS
70.0000 mg | ORAL_CAPSULE | Freq: Every day | ORAL | 0 refills | Status: DC
Start: 2023-10-19 — End: 2023-11-28

## 2023-09-19 MED ORDER — LISDEXAMFETAMINE DIMESYLATE 70 MG PO CAPS
70.0000 mg | ORAL_CAPSULE | Freq: Every day | ORAL | 0 refills | Status: DC
Start: 2023-09-19 — End: 2023-11-28

## 2023-09-19 NOTE — Assessment & Plan Note (Signed)
Stable on current dose.  Follow-up in 3 months   Indication for controlled substance: ADHD Medication and dose: Vyvanse 70 mg daily # pills per month: 30 Last UDS date: ordered for 12/20/22 Controlled Substance Treatment Agreement signed (Y/N): yes, 12/20/22

## 2023-09-19 NOTE — Assessment & Plan Note (Signed)
Significant weight loss from 263 lbs in March to 212-214 lbs currently, secondary to [facility] use and lifestyle modifications. No reported side effects or concerns. -Continue current regimen.

## 2023-09-19 NOTE — Progress Notes (Signed)
Virtual Video Visit via MyChart Note  I connected with  [name] on 09/19/23 at  2:40 PM EST by the video enabled telemedicine application for MyChart, and verified that I am speaking with the correct person using two identifiers.   I introduced myself as a Publishing rights manager with the practice. We discussed the limitations of evaluation and management by telemedicine and the availability of in person appointments. The patient expressed understanding and agreed to proceed.  Participating parties in this visit include: The patient and the nurse practitioner listed.  The patient is: At home I am: at home  Subjective:    CC:  Chief Complaint  Patient presents with   ADHD   Medical Management of Chronic Issues    HPI: Janet Glover is a 57 y.o. year old female presenting today via MyChart today for routine 25-month follow-up on Vyvanse.  Discussed the use of AI scribe software for clinical note transcription with the patient, who gave verbal consent to proceed.  History of Present Illness   The patient presents for a routine follow-up visit, primarily seeking medication refills. They report no new health concerns, denying any palpitations or sleep disturbances. They have been monitoring their blood pressure and heart rate at home, which they report as being stable/low.  The patient has been on Wegovy and reports significant weight loss, from 263 lbs (March 2024) to around 212-214 lbs. They acknowledge some weight fluctuation, particularly around the holiday season, but overall, they are pleased with their progress.          Home weight today 212 lbs.  Wt Readings from Last 3 Encounters:  06/03/23 226 lb (102.5 kg)  03/21/23 238 lb (108 kg)  12/20/22 263 lb (119.3 kg)      Past medical history, Surgical history, Family history not pertinant except as noted below, Social history, Allergies, and medications have been entered into the medical record, reviewed, and corrections  made.   Review of Systems:  All review of systems negative except what is listed in the HPI   Objective:    General:  Speaking clearly in complete sentences. Absent shortness of breath noted.   Alert and oriented x3.   Normal judgment.  Absent acute distress.   Impression and Recommendations:    Problem List Items Addressed This Visit       Active Problems   Attention and concentration deficit (Chronic)   Stable on current dose.  Follow-up in 3 months   Indication for controlled substance: ADHD Medication and dose: Vyvanse 70 mg daily # pills per month: 30 Last UDS date: ordered for 12/20/22 Controlled Substance Treatment Agreement signed (Y/N): yes, 12/20/22       Relevant Medications   lisdexamfetamine (VYVANSE) 70 MG capsule (Start on 11/18/2023)   lisdexamfetamine (VYVANSE) 70 MG capsule (Start on 10/19/2023)   lisdexamfetamine (VYVANSE) 70 MG capsule   Obesity (BMI 30-39.9) - Primary   Significant weight loss from 263 lbs in March to 212-214 lbs currently, secondary to [facility] use and lifestyle modifications. No reported side effects or concerns. -Continue current regimen.            Relevant Medications   lisdexamfetamine (VYVANSE) 70 MG capsule (Start on 11/18/2023)   lisdexamfetamine (VYVANSE) 70 MG capsule (Start on 10/19/2023)   lisdexamfetamine (VYVANSE) 70 MG capsule       Follow-up if symptoms worsen or fail to improve.    I discussed the assessment and treatment plan with the patient. The patient was provided an opportunity  to ask questions and all were answered. The patient agreed with the plan and demonstrated an understanding of the instructions.   The patient was advised to call back or seek an in-person evaluation if the symptoms worsen or if the condition fails to improve as anticipated.   Clayborne Dana, NP

## 2023-11-21 ENCOUNTER — Ambulatory Visit: Payer: BC Managed Care – PPO | Admitting: Family Medicine

## 2023-11-21 DIAGNOSIS — F909 Attention-deficit hyperactivity disorder, unspecified type: Secondary | ICD-10-CM

## 2023-11-28 ENCOUNTER — Ambulatory Visit (INDEPENDENT_AMBULATORY_CARE_PROVIDER_SITE_OTHER): Payer: BC Managed Care – PPO | Admitting: Family Medicine

## 2023-11-28 ENCOUNTER — Encounter: Payer: Self-pay | Admitting: Family Medicine

## 2023-11-28 VITALS — BP 119/80 | HR 87 | Ht 64.0 in | Wt 211.0 lb

## 2023-11-28 DIAGNOSIS — I10 Essential (primary) hypertension: Secondary | ICD-10-CM

## 2023-11-28 DIAGNOSIS — Z79899 Other long term (current) drug therapy: Secondary | ICD-10-CM | POA: Diagnosis not present

## 2023-11-28 DIAGNOSIS — E66813 Obesity, class 3: Secondary | ICD-10-CM | POA: Diagnosis not present

## 2023-11-28 DIAGNOSIS — R41840 Attention and concentration deficit: Secondary | ICD-10-CM | POA: Diagnosis not present

## 2023-11-28 DIAGNOSIS — Z6841 Body Mass Index (BMI) 40.0 and over, adult: Secondary | ICD-10-CM

## 2023-11-28 MED ORDER — LISDEXAMFETAMINE DIMESYLATE 70 MG PO CAPS
70.0000 mg | ORAL_CAPSULE | Freq: Every day | ORAL | 0 refills | Status: DC
Start: 2024-01-16 — End: 2024-03-01

## 2023-11-28 MED ORDER — LISDEXAMFETAMINE DIMESYLATE 70 MG PO CAPS
70.0000 mg | ORAL_CAPSULE | Freq: Every day | ORAL | 0 refills | Status: DC
Start: 2023-12-17 — End: 2024-03-01

## 2023-11-28 NOTE — Progress Notes (Signed)
 Established Patient Office Visit  Subjective   Patient ID: Janet Glover, female    DOB: June 08, 1966  Age: 58 y.o. MRN: [id number]  Chief Complaint  Patient presents with   Medical Management of Chronic Issues    HPI    Patient is here for routine 92-month follow-up. No new concerns. She is looking forward to an upcoming cruise with her family to celebrate her son's high school graduation.    ADHD/ADD: - Medications: Vyvanse 70 mg daily - Compliance good - Side effects: no - Concerns:none - Contract: due today  - UDS: due today   Hypertension: - Medications: losartan-hctz 100-25 mg daily - Compliance: good - Checking BP at home: occasionally, has gotten some lower readings around 107-110 SBP with mild dizziness at times - Denies any SOB, recurrent headaches, CP, vision changes, LE edema, dizziness, palpitations, or medication side effects.   Obesity: - Medications: Wegovy 2.4 mg/week  - Diet: heart healthy, portion control, cooking at home mostly - Exercise: regularly   Wt Readings from Last 3 Encounters:  11/28/23 211 lb (95.7 kg)  06/03/23 226 lb (102.5 kg)  03/21/23 238 lb (108 kg)    Vitamin D deficiency: - Supplementation: high dose weekly supplement. Stable at last check.        ROS All review of systems negative except what is listed in the HPI    Objective:     BP 119/80   Pulse 87   Ht 5\' 4"  (1.626 m)   Wt 211 lb (95.7 kg)   LMP 08/09/2018   SpO2 99%   BMI 36.22 kg/m    Physical Exam Vitals reviewed.  Constitutional:      Appearance: Normal appearance.  Cardiovascular:     Rate and Rhythm: Normal rate and regular rhythm.     Pulses: Normal pulses.     Heart sounds: Normal heart sounds.  Pulmonary:     Effort: Pulmonary effort is normal.     Breath sounds: Normal breath sounds.  Skin:    General: Skin is warm and dry.  Neurological:     Mental Status: She is alert and oriented to person, place, and time.  Psychiatric:         Mood and Affect: Mood normal.        Behavior: Behavior normal.        Thought Content: Thought content normal.        Judgment: Judgment normal.      No results found for any visits on 11/28/23.    The 10-year ASCVD risk score (Arnett DK, et al., 2019) is: 2.1%    Assessment & Plan:   Problem List Items Addressed This Visit       Active Problems   Obesity (Chronic)   Continue current lifestyle measures  -healthy diet, regular exercise, portion control Getting good response with Wegovy 2.4 mg      Relevant Medications   lisdexamfetamine (VYVANSE) 70 MG capsule (Start on 01/16/2024)   lisdexamfetamine (VYVANSE) 70 MG capsule (Start on 12/17/2023)   Attention and concentration deficit (Chronic)   Stable on current dose.  Follow-up in 3 months   Indication for controlled substance: ADHD Medication and dose: Vyvanse 70 mg daily # pills per month: 30 Last UDS date: today 11/28/23 Controlled Substance Treatment Agreement signed (Y/N): yes, 11/28/23       Relevant Medications   lisdexamfetamine (VYVANSE) 70 MG capsule (Start on 01/16/2024)   lisdexamfetamine (VYVANSE) 70 MG capsule (Start on 12/17/2023)   Essential  hypertension, benign (Chronic)   Blood pressure is at goal for age and co-morbidities.   Recommendations: continue lisinopril - hctz 100-25 mg daily - monitor BP at home; hold/delay dose if SBP <110 - BP goal <130/80 - monitor and log blood pressures at home - check around the same time each day in a relaxed setting - Limit salt to <2000 mg/day - Follow DASH eating plan (heart healthy diet) - limit alcohol to 2 standard drinks per day for men and 1 per day for women - avoid tobacco products - get at least 2 hours of regular aerobic exercise weekly Patient aware of signs/symptoms requiring further/urgent evaluation.       Other Visit Diagnoses       High risk medication use    -  Primary   Relevant Orders   Drug Monitoring Panel [phone] , Urine           Return in about 3 months (around 02/28/2024) for physical.    Clayborne Dana, NP

## 2023-11-28 NOTE — Assessment & Plan Note (Signed)
 Stable on current dose.  Follow-up in 3 months   Indication for controlled substance: ADHD Medication and dose: Vyvanse 70 mg daily # pills per month: 30 Last UDS date: today 11/28/23 Controlled Substance Treatment Agreement signed (Y/N): yes, 11/28/23

## 2023-11-28 NOTE — Assessment & Plan Note (Signed)
Continue current lifestyle measures  -healthy diet, regular exercise, portion control Getting good response with Wegovy 2.4 mg

## 2023-11-28 NOTE — Assessment & Plan Note (Signed)
 Blood pressure is at goal for age and co-morbidities.   Recommendations: continue lisinopril - hctz 100-25 mg daily - monitor BP at home; hold/delay dose if SBP <110 - BP goal <130/80 - monitor and log blood pressures at home - check around the same time each day in a relaxed setting - Limit salt to <2000 mg/day - Follow DASH eating plan (heart healthy diet) - limit alcohol to 2 standard drinks per day for men and 1 per day for women - avoid tobacco products - get at least 2 hours of regular aerobic exercise weekly Patient aware of signs/symptoms requiring further/urgent evaluation.

## 2023-11-30 LAB — DM TEMPLATE

## 2023-12-01 ENCOUNTER — Encounter: Payer: Self-pay | Admitting: Family Medicine

## 2023-12-01 LAB — DRUG MONITORING PANEL 376104, URINE
Barbiturates: NEGATIVE ng/mL (ref <300)
Benzodiazepines: NEGATIVE ng/mL (ref <100)
Cocaine Metabolite: NEGATIVE ng/mL (ref <150)
Desmethyltramadol: NEGATIVE ng/mL (ref <100)
Methamphetamine: NEGATIVE ng/mL (ref <250)
Opiates: NEGATIVE ng/mL (ref <100)
Oxycodone: NEGATIVE ng/mL (ref <100)
Tramadol Comments: 3350 ng/mL — ABNORMAL HIGH (ref <250)
Tramadol: NEGATIVE ng/mL (ref <100)
Tramadol: POSITIVE ng/mL — AB (ref <500)
medMATCH Summary: NEGATIVE ng/mL (ref <100)

## 2023-12-01 LAB — DM TEMPLATE

## 2024-03-01 ENCOUNTER — Encounter: Payer: Self-pay | Admitting: Family Medicine

## 2024-03-01 ENCOUNTER — Telehealth: Payer: Self-pay | Admitting: Family Medicine

## 2024-03-01 ENCOUNTER — Ambulatory Visit: Admitting: Family Medicine

## 2024-03-01 VITALS — BP 131/83 | HR 83 | Ht 64.0 in | Wt 205.0 lb

## 2024-03-01 DIAGNOSIS — I10 Essential (primary) hypertension: Secondary | ICD-10-CM

## 2024-03-01 DIAGNOSIS — E559 Vitamin D deficiency, unspecified: Secondary | ICD-10-CM

## 2024-03-01 DIAGNOSIS — Z6841 Body Mass Index (BMI) 40.0 and over, adult: Secondary | ICD-10-CM

## 2024-03-01 DIAGNOSIS — F909 Attention-deficit hyperactivity disorder, unspecified type: Secondary | ICD-10-CM

## 2024-03-01 DIAGNOSIS — E66813 Obesity, class 3: Secondary | ICD-10-CM

## 2024-03-01 DIAGNOSIS — Z780 Asymptomatic menopausal state: Secondary | ICD-10-CM | POA: Diagnosis not present

## 2024-03-01 DIAGNOSIS — Z0000 Encounter for general adult medical examination without abnormal findings: Secondary | ICD-10-CM

## 2024-03-01 DIAGNOSIS — R739 Hyperglycemia, unspecified: Secondary | ICD-10-CM

## 2024-03-01 MED ORDER — LISDEXAMFETAMINE DIMESYLATE 60 MG PO CAPS: 60.0000 mg | ORAL_CAPSULE | ORAL | 0 refills | Status: DC

## 2024-03-01 NOTE — Progress Notes (Signed)
 Established Patient Office Visit  Subjective   Patient ID: Janet Glover, female    DOB: 12/12/1965  Age: 58 y.o. MRN: [id number]  Chief Complaint  Patient presents with   Medical Management of Chronic Issues    HPI    Patient is here for routine 35-month follow-up. No new concerns.     ADHD/ADD: - Medications: Vyvanse  70 mg daily - pharmacy recently switched her to generic which she likes the cost better but feels does may be too strong; would like to try decreasing to 60 mg - Compliance good - Side effects: no - Contract: UTD - UDS: UTD   Hypertension: - Medications: losartan -hctz 100-25 mg daily - Compliance: good - Checking BP at home: occasionally, has gotten some lower readings around 107-110 SBP with mild dizziness at times - Denies any SOB, recurrent headaches, CP, vision changes, LE edema, dizziness, palpitations, or medication side effects.   Obesity, prediabetes: - Medications: Zepbound 7.5 mg weekly - prescribed elsewhere - Diet: heart healthy, portion control, cooking at home mostly - Exercise: regularly    Lab Results  Component Value Date   HGBA1C 5.5 06/09/2023    Wt Readings from Last 3 Encounters:  03/01/24 205 lb (93 kg)  11/28/23 211 lb (95.7 kg)  06/03/23 226 lb (102.5 kg)    Vitamin D  deficiency: - Supplementation: high dose weekly supplement. Stable at last check.        ROS All review of systems negative except what is listed in the HPI    Objective:     BP 131/83   Pulse 83   Ht 5\' 4"  (1.626 m)   Wt 205 lb (93 kg)   LMP 08/09/2018   SpO2 100%   BMI 35.19 kg/m    Physical Exam Vitals reviewed.  Constitutional:      Appearance: Normal appearance.  Cardiovascular:     Rate and Rhythm: Normal rate and regular rhythm.     Pulses: Normal pulses.     Heart sounds: Normal heart sounds.  Pulmonary:     Effort: Pulmonary effort is normal.     Breath sounds: Normal breath sounds.  Skin:    General: Skin is warm and  dry.  Neurological:     Mental Status: She is alert and oriented to person, place, and time.  Psychiatric:        Mood and Affect: Mood normal.        Behavior: Behavior normal.        Thought Content: Thought content normal.        Judgment: Judgment normal.      No results found for any visits on 03/01/24.    The 10-year ASCVD risk score (Arnett DK, et al., 2019) is: 2.5%    Assessment & Plan:   Problem List Items Addressed This Visit       Active Problems   Obesity (Chronic)   She lost six pounds since March, attributing knee pain improvement to weight loss and supportive footwear. Transitioned from Wegovy  to Zepbound for weight management. - Update medication record to reflect switch from Wegovy  to Zepbound.      Relevant Medications   ZEPBOUND 7.5 MG/0.5ML Pen   lisdexamfetamine (VYVANSE ) 60 MG capsule   Vitamin D  deficiency (Chronic)   Supplement and monitor      Essential hypertension, benign (Chronic)   Blood pressure is at goal for age and co-morbidities.   Recommendations: continue lisinopril - hctz 100-25 mg daily  - BP goal <130/80 -  monitor and log blood pressures at home - check around the same time each day in a relaxed setting - Limit salt to <2000 mg/day - Follow DASH eating plan (heart healthy diet) - limit alcohol to 2 standard drinks per day for men and 1 per day for women - avoid tobacco products - get at least 2 hours of regular aerobic exercise weekly Patient aware of signs/symptoms requiring further/urgent evaluation.       Adult ADHD - Primary   Generic Vyvanse  70 mg causes overstimulation. Considering dose adjustment to 60 mg due to cost preference despite side effects. - Send prescription  - Instruct her to message if issues with 60 mg dose arise for alternative arrangement.      Relevant Medications   lisdexamfetamine (VYVANSE ) 60 MG capsule   Other Visit Diagnoses       Menopause     Experiences dyspareunia and hot flashes,  seeks gynecologist for management, prefers female provider. - Refer to gynecology within the same building, preferably to a female provider.    Relevant Orders   Ambulatory referral to Obstetrics / Gynecology          Return in about 3 months (around 06/01/2024) for physical.    Everlina Hock, NP

## 2024-03-01 NOTE — Assessment & Plan Note (Signed)
 Generic Vyvanse  70 mg causes overstimulation. Considering dose adjustment to 60 mg due to cost preference despite side effects. - Send prescription  - Instruct her to message if issues with 60 mg dose arise for alternative arrangement.

## 2024-03-01 NOTE — Assessment & Plan Note (Signed)
Blood pressure is at goal for age and co-morbidities.   Recommendations: continue lisinopril - hctz 100-25 mg daily - BP goal <130/80 - monitor and log blood pressures at home - check around the same time each day in a relaxed setting - Limit salt to <2000 mg/day - Follow DASH eating plan (heart healthy diet) - limit alcohol to 2 standard drinks per day for men and 1 per day for women - avoid tobacco products - get at least 2 hours of regular aerobic exercise weekly Patient aware of signs/symptoms requiring further/urgent evaluation.

## 2024-03-01 NOTE — Telephone Encounter (Signed)
 Pended labs, okay to order? Anything to add? Dx?

## 2024-03-01 NOTE — Telephone Encounter (Signed)
 Pt scheduled her CPE for Sept 2 2025 and mentioned labs the week before. I did not schedule do to no orders.

## 2024-03-01 NOTE — Assessment & Plan Note (Signed)
 She lost six pounds since March, attributing knee pain improvement to weight loss and supportive footwear. Transitioned from Wegovy  to Zepbound for weight management. - Update medication record to reflect switch from Wegovy  to Zepbound.

## 2024-03-01 NOTE — Assessment & Plan Note (Signed)
 Supplement and monitor

## 2024-03-02 NOTE — Telephone Encounter (Signed)
 LVM letting patient know labs ordered and if she would like them done prior to appt in September to call our office to schedule.

## 2024-03-05 ENCOUNTER — Other Ambulatory Visit (HOSPITAL_BASED_OUTPATIENT_CLINIC_OR_DEPARTMENT_OTHER): Payer: Self-pay | Admitting: Family Medicine

## 2024-03-05 DIAGNOSIS — Z1231 Encounter for screening mammogram for malignant neoplasm of breast: Secondary | ICD-10-CM

## 2024-03-06 ENCOUNTER — Encounter: Payer: Self-pay | Admitting: Family Medicine

## 2024-03-06 DIAGNOSIS — F909 Attention-deficit hyperactivity disorder, unspecified type: Secondary | ICD-10-CM

## 2024-03-07 MED ORDER — LISDEXAMFETAMINE DIMESYLATE 70 MG PO CAPS
70.0000 mg | ORAL_CAPSULE | Freq: Every day | ORAL | 0 refills | Status: DC
Start: 2024-04-05 — End: 2024-05-29

## 2024-03-07 MED ORDER — LISDEXAMFETAMINE DIMESYLATE 70 MG PO CAPS
70.0000 mg | ORAL_CAPSULE | Freq: Every day | ORAL | 0 refills | Status: DC
Start: 2024-03-07 — End: 2024-05-29

## 2024-03-07 MED ORDER — LISDEXAMFETAMINE DIMESYLATE 70 MG PO CAPS
70.0000 mg | ORAL_CAPSULE | Freq: Every day | ORAL | 0 refills | Status: DC
Start: 2024-05-05 — End: 2024-05-29

## 2024-03-07 NOTE — Telephone Encounter (Signed)
 LVM for pharmacy cancelling 60 mg dosage.

## 2024-04-05 ENCOUNTER — Ambulatory Visit (INDEPENDENT_AMBULATORY_CARE_PROVIDER_SITE_OTHER): Admitting: Family Medicine

## 2024-04-05 ENCOUNTER — Encounter: Payer: Self-pay | Admitting: Family Medicine

## 2024-04-05 VITALS — BP 128/62 | HR 100 | Ht 64.0 in | Wt 199.0 lb

## 2024-04-05 DIAGNOSIS — B359 Dermatophytosis, unspecified: Secondary | ICD-10-CM

## 2024-04-05 MED ORDER — CLOTRIMAZOLE 1 % EX CREA
1.0000 | TOPICAL_CREAM | Freq: Two times a day (BID) | CUTANEOUS | 1 refills | Status: DC
Start: 2024-04-05 — End: 2024-05-29

## 2024-04-05 NOTE — Progress Notes (Signed)
   Acute Office Visit  Subjective:     Patient ID: Janet Glover, female    DOB: 03-14-1966, 58 y.o.   MRN: [id number]   HPI Patient is in today for rash.  Discussed the use of AI scribe software for clinical note transcription with the patient, who gave verbal consent to proceed.  History of Present Illness Janet Glover is a 58 year old female who presents with a suspected ringworm infection.  She developed a skin lesion on her arm after handling a kitten diagnosed with ringworm. Initially, she thought it was a mosquito bite. The lesion has a central bite mark, which she suspects may have been scratched, potentially allowing the fungal infection to enter.  She has been washing her hands frequently but not her arms as thoroughly. No systemic symptoms.       ROS All review of systems negative except what is listed in the HPI      Objective:    BP 128/62   Pulse 100   Ht 5' 4 (1.626 m)   Wt 199 lb (90.3 kg)   LMP 08/09/2018   SpO2 100%   BMI 34.16 kg/m    Physical Exam Vitals reviewed.  Constitutional:      Appearance: Normal appearance.  Skin:    General: Skin is warm and dry.     Findings: Rash present.  Neurological:     Mental Status: She is alert and oriented to person, place, and time.  Psychiatric:        Mood and Affect: Mood normal.        Behavior: Behavior normal.        Thought Content: Thought content normal.        Judgment: Judgment normal.    Right forearm:    No results found for any visits on 04/05/24.      Assessment & Plan:   Problem List Items Addressed This Visit   None Visit Diagnoses       Ringworm    -  Primary   Relevant Medications   clotrimazole  (LOTRIMIN ) 1 % cream      Assessment & Plan Ringworm - Prescribed antifungal cream for 2-3 weeks. - Provided ringworm medication information sheet. Patient aware of signs/symptoms requiring further/urgent evaluation.    Meds ordered this encounter  Medications    clotrimazole  (LOTRIMIN ) 1 % cream    Sig: Apply 1 Application topically 2 (two) times daily. Continue for 2-3 weeks until area improves.    Dispense:  30 g    Refill:  1    Supervising Provider:   DOMENICA BLACKBIRD A [[id number]]    Return if symptoms worsen or fail to improve.  Waddell KATHEE Mon, NP

## 2024-04-16 ENCOUNTER — Ambulatory Visit (HOSPITAL_BASED_OUTPATIENT_CLINIC_OR_DEPARTMENT_OTHER)
Admission: RE | Admit: 2024-04-16 | Discharge: 2024-04-16 | Disposition: A | Discharge status: Home / Self Care | Source: Ambulatory Visit | Attending: Family Medicine | Admitting: Family Medicine

## 2024-04-16 ENCOUNTER — Encounter (HOSPITAL_BASED_OUTPATIENT_CLINIC_OR_DEPARTMENT_OTHER): Payer: Self-pay

## 2024-04-16 DIAGNOSIS — Z1231 Encounter for screening mammogram for malignant neoplasm of breast: Secondary | ICD-10-CM | POA: Insufficient documentation

## 2024-04-19 ENCOUNTER — Other Ambulatory Visit: Payer: Self-pay | Admitting: Family Medicine

## 2024-04-19 DIAGNOSIS — R928 Other abnormal and inconclusive findings on diagnostic imaging of breast: Secondary | ICD-10-CM

## 2024-04-23 ENCOUNTER — Encounter: Payer: Self-pay | Admitting: Family Medicine

## 2024-04-23 ENCOUNTER — Ambulatory Visit
Admission: RE | Admit: 2024-04-23 | Discharge: 2024-04-23 | Disposition: A | Discharge status: Home / Self Care | Source: Ambulatory Visit | Attending: Family Medicine | Admitting: Family Medicine

## 2024-04-23 ENCOUNTER — Other Ambulatory Visit: Payer: Self-pay | Admitting: Family Medicine

## 2024-04-23 ENCOUNTER — Ambulatory Visit

## 2024-04-23 DIAGNOSIS — R928 Other abnormal and inconclusive findings on diagnostic imaging of breast: Secondary | ICD-10-CM

## 2024-04-25 ENCOUNTER — Encounter

## 2024-04-26 ENCOUNTER — Ambulatory Visit
Admission: RE | Admit: 2024-04-26 | Discharge: 2024-04-26 | Disposition: A | Discharge status: Home / Self Care | Source: Ambulatory Visit | Attending: Family Medicine | Admitting: Family Medicine

## 2024-04-26 ENCOUNTER — Ambulatory Visit
Admission: RE | Admit: 2024-04-26 | Discharge: 2024-04-26 | Disposition: A | Discharge status: Home / Self Care | Source: Ambulatory Visit | Attending: Family Medicine

## 2024-04-26 ENCOUNTER — Other Ambulatory Visit: Payer: Self-pay | Admitting: Family Medicine

## 2024-04-26 DIAGNOSIS — R928 Other abnormal and inconclusive findings on diagnostic imaging of breast: Secondary | ICD-10-CM

## 2024-04-26 HISTORY — PX: BREAST BIOPSY: SHX20

## 2024-04-27 LAB — SURGICAL PATHOLOGY

## 2024-05-25 ENCOUNTER — Other Ambulatory Visit (INDEPENDENT_AMBULATORY_CARE_PROVIDER_SITE_OTHER)

## 2024-05-25 DIAGNOSIS — Z0000 Encounter for general adult medical examination without abnormal findings: Secondary | ICD-10-CM

## 2024-05-25 DIAGNOSIS — I10 Essential (primary) hypertension: Secondary | ICD-10-CM | POA: Diagnosis not present

## 2024-05-25 DIAGNOSIS — R739 Hyperglycemia, unspecified: Secondary | ICD-10-CM

## 2024-05-25 DIAGNOSIS — E559 Vitamin D deficiency, unspecified: Secondary | ICD-10-CM

## 2024-05-25 LAB — COMPREHENSIVE METABOLIC PANEL WITH GFR
ALT: 8 U/L (ref 0–35)
AST: 13 U/L (ref 0–37)
Albumin: 4.6 g/dL (ref 3.5–5.2)
Alkaline Phosphatase: 57 U/L (ref 39–117)
BUN: 19 mg/dL (ref 6–23)
CO2: 31 meq/L (ref 19–32)
Calcium: 10 mg/dL (ref 8.4–10.5)
Chloride: 99 meq/L (ref 96–112)
Creatinine, Ser: 0.88 mg/dL (ref 0.40–1.20)
GFR: 73.18 mL/min (ref >60.00)
Glucose, Bld: 90 mg/dL (ref 70–99)
Potassium: 4.8 meq/L (ref 3.5–5.1)
Sodium: 138 meq/L (ref 135–145)
Total Bilirubin: 0.5 mg/dL (ref 0.2–1.2)
Total Protein: 7.2 g/dL (ref 6.0–8.3)

## 2024-05-25 LAB — LIPID PANEL
Cholesterol: 230 mg/dL — ABNORMAL HIGH (ref 0–200)
HDL: 73.6 mg/dL (ref >39.00)
LDL Cholesterol: 137 mg/dL — ABNORMAL HIGH (ref 0–99)
NonHDL: 156.14
Total CHOL/HDL Ratio: 3
Triglycerides: 96 mg/dL (ref 0.0–149.0)
VLDL: 19.2 mg/dL (ref 0.0–40.0)

## 2024-05-25 LAB — CBC WITH DIFFERENTIAL/PLATELET
Basophils Absolute: 0 K/uL (ref 0.0–0.1)
Basophils Relative: 0.7 % (ref 0.0–3.0)
Eosinophils Absolute: 0.2 K/uL (ref 0.0–0.7)
Eosinophils Relative: 3.4 % (ref 0.0–5.0)
HCT: 43.9 % (ref 36.0–46.0)
Hemoglobin: 14.1 g/dL (ref 12.0–15.0)
Lymphocytes Relative: 30 % (ref 12.0–46.0)
Lymphs Abs: 1.9 K/uL (ref 0.7–4.0)
MCHC: 32.2 g/dL (ref 30.0–36.0)
MCV: 80.9 fl (ref 78.0–100.0)
Monocytes Absolute: 0.7 K/uL (ref 0.1–1.0)
Monocytes Relative: 10.5 % (ref 3.0–12.0)
Neutro Abs: 3.6 K/uL (ref 1.4–7.7)
Neutrophils Relative %: 55.4 % (ref 43.0–77.0)
Platelets: 283 K/uL (ref 150.0–400.0)
RBC: 5.42 Mil/uL — ABNORMAL HIGH (ref 3.87–5.11)
RDW: 13.2 % (ref 11.5–15.5)
WBC: 6.5 K/uL (ref 4.0–10.5)

## 2024-05-25 LAB — HEMOGLOBIN A1C: Hgb A1c MFr Bld: 5.6 % (ref 4.6–6.5)

## 2024-05-25 LAB — TSH: TSH: 2.42 u[IU]/mL (ref 0.35–5.50)

## 2024-05-25 LAB — VITAMIN D 25 HYDROXY (VIT D DEFICIENCY, FRACTURES): VITD: 34.99 ng/mL (ref 30.00–100.00)

## 2024-05-29 ENCOUNTER — Encounter: Payer: Self-pay | Admitting: Family Medicine

## 2024-05-29 ENCOUNTER — Ambulatory Visit (INDEPENDENT_AMBULATORY_CARE_PROVIDER_SITE_OTHER): Admitting: Family Medicine

## 2024-05-29 VITALS — BP 120/50 | HR 95 | Ht 64.0 in | Wt 193.0 lb

## 2024-05-29 DIAGNOSIS — E559 Vitamin D deficiency, unspecified: Secondary | ICD-10-CM

## 2024-05-29 DIAGNOSIS — F909 Attention-deficit hyperactivity disorder, unspecified type: Secondary | ICD-10-CM | POA: Diagnosis not present

## 2024-05-29 DIAGNOSIS — Z0000 Encounter for general adult medical examination without abnormal findings: Secondary | ICD-10-CM

## 2024-05-29 DIAGNOSIS — I10 Essential (primary) hypertension: Secondary | ICD-10-CM | POA: Diagnosis not present

## 2024-05-29 MED ORDER — LOSARTAN POTASSIUM-HCTZ 100-25 MG PO TABS
1.0000 | ORAL_TABLET | Freq: Every morning | ORAL | 1 refills | Status: AC
Start: 2024-05-29 — End: ?

## 2024-05-29 MED ORDER — VITAMIN D (ERGOCALCIFEROL) 1.25 MG (50000 UNIT) PO CAPS: 50000.0000 [IU] | ORAL_CAPSULE | ORAL | 3 refills | Status: AC

## 2024-05-29 MED ORDER — LISDEXAMFETAMINE DIMESYLATE 60 MG PO CAPS
60.0000 mg | ORAL_CAPSULE | Freq: Every day | ORAL | 0 refills | Status: DC
Start: 2024-05-29 — End: 2024-06-19

## 2024-05-29 NOTE — Assessment & Plan Note (Signed)
 Stable with high dose weekly supplement.

## 2024-05-29 NOTE — Assessment & Plan Note (Signed)
 Wants to try to drop down to 60 mg dose. She will let me know cost/coverage if we need to adjust anything. F/u 3 months

## 2024-05-29 NOTE — Progress Notes (Signed)
 Complete physical exam  Patient: Janet Glover   DOB: 14-May-1966   57 y.o. Female  MRN: [id number]  Subjective:    Chief Complaint  Patient presents with   Annual Exam    Janet Glover is a 58 y.o. female who presents today for a complete physical exam. She reports consuming a general diet. Home exercise routine includes [employer], walking, gym a few days per week. She generally feels well. She reports sleeping fairly well. She does not have additional problems to discuss today.   Currently lives with: spouse Acute concerns or interim problems since last visit: no  Vision concerns: no Dental concerns: no  ETOH use: no Nicotine use: no Recreational drugs/illegal substances: no   Wt Readings from Last 3 Encounters:  05/29/24 193 lb (87.5 kg)  04/05/24 199 lb (90.3 kg)  03/01/24 205 lb (93 kg)       Most recent fall risk assessment:    05/29/2024    8:31 AM  Fall Risk   Falls in the past year? 0  Number falls in past yr: 0  Injury with Fall? 0  Risk for fall due to : No Fall Risks  Follow up Falls evaluation completed     Most recent depression screenings:    05/29/2024    8:31 AM 12/20/2022    4:14 PM  PHQ 2/9 Scores  PHQ - 2 Score 0 0  PHQ- 9 Score 2 0            Patient Care Team: Almarie Waddell NOVAK, NP as PCP - General (Family Medicine)   Outpatient Medications Prior to Visit  Medication Sig   estradiol (ESTRACE) 0.1 MG/GM vaginal cream Place 1 Applicatorful vaginally 3 (three) times a week.   estradiol (VIVELLE-DOT) 0.05 MG/24HR patch Place 1 patch onto the skin 2 (two) times a week.   MOUNJARO 10 MG/0.5ML Pen Inject 10 mg into the skin once a week.   progesterone  (PROMETRIUM ) 100 MG capsule Take 100 mg by mouth at bedtime.   [DISCONTINUED] lisdexamfetamine (VYVANSE ) 70 MG capsule Take 1 capsule (70 mg total) by mouth daily.   [DISCONTINUED] lisdexamfetamine (VYVANSE ) 70 MG capsule Take 1 capsule (70 mg total) by mouth daily.   [DISCONTINUED]  lisdexamfetamine (VYVANSE ) 70 MG capsule Take 1 capsule (70 mg total) by mouth daily.   [DISCONTINUED] losartan -hydrochlorothiazide (HYZAAR) 100-25 MG tablet Take 1 tablet by mouth every morning.   [DISCONTINUED] Vitamin D , Ergocalciferol , (DRISDOL ) 1.25 MG (50000 UNIT) CAPS capsule Take 1 capsule (50,000 Units total) by mouth every 7 (seven) days.   [DISCONTINUED] clotrimazole  (LOTRIMIN ) 1 % cream Apply 1 Application topically 2 (two) times daily. Continue for 2-3 weeks until area improves.   No facility-administered medications prior to visit.    ROS All review of systems negative except what is listed in the HPI        Objective:     BP (!) 120/50   Pulse 95   Ht 5' 4 (1.626 m)   Wt 193 lb (87.5 kg)   LMP 08/09/2018   SpO2 100%   BMI 33.13 kg/m    Physical Exam Vitals reviewed.  Constitutional:      General: She is not in acute distress.    Appearance: Normal appearance. She is not ill-appearing.  HENT:     Head: Normocephalic and atraumatic.     Right Ear: Tympanic membrane normal.     Left Ear: Tympanic membrane normal.     Nose: Nose normal.  Mouth/Throat:     Mouth: Mucous membranes are moist.     Pharynx: Oropharynx is clear.  Eyes:     Extraocular Movements: Extraocular movements intact.     Conjunctiva/sclera: Conjunctivae normal.     Pupils: Pupils are equal, round, and reactive to light.  Cardiovascular:     Rate and Rhythm: Normal rate and regular rhythm.     Pulses: Normal pulses.     Heart sounds: Normal heart sounds.  Pulmonary:     Effort: Pulmonary effort is normal.     Breath sounds: Normal breath sounds.  Abdominal:     General: Abdomen is flat. Bowel sounds are normal. There is no distension.     Palpations: Abdomen is soft. There is no mass.     Tenderness: There is no abdominal tenderness. There is no right CVA tenderness, left CVA tenderness, guarding or rebound.  Genitourinary:    Comments: Deferred exam Musculoskeletal:         General: Normal range of motion.     Cervical back: Normal range of motion and neck supple. No tenderness.     Right lower leg: No edema.     Left lower leg: No edema.  Lymphadenopathy:     Cervical: No cervical adenopathy.  Skin:    General: Skin is warm and dry.     Capillary Refill: Capillary refill takes less than 2 seconds.  Neurological:     General: No focal deficit present.     Mental Status: She is alert and oriented to person, place, and time. Mental status is at baseline.  Psychiatric:        Mood and Affect: Mood normal.        Behavior: Behavior normal.        Thought Content: Thought content normal.        Judgment: Judgment normal.          No results found for any visits on 05/29/24.     Assessment & Plan:    Routine Health Maintenance and Physical Exam Discussed health promotion and safety including diet and exercise recommendations, dental health, and injury prevention. Tobacco cessation if applicable. Seat belts, sunscreen, smoke detectors, etc.    Immunization History  Administered Date(s) Administered   Influenza Inj Mdck Quad Pf 06/19/2022   Influenza, Seasonal, Injecte, Preservative Fre 06/09/2023   Influenza,inj,Quad PF,6+ Mos 06/08/2017, 06/14/2018, 09/03/2019   Influenza-Unspecified 09/01/2016, 06/27/2021, 06/19/2022   MMR 06/20/2019   Moderna Covid-19 Vaccine Bivalent Booster 55yrs & up 07/15/2021   PFIZER(Purple Top)SARS-COV-2 Vaccination 12/17/2019, 01/07/2020, 07/25/2020   Pfizer(Comirnaty )Fall Seasonal Vaccine 12 years and older 06/09/2023   Td 09/27/2002   Td (Adult),5 Lf Tetanus Toxid, Preservative Free 09/27/2002   Tdap 12/14/2006, 02/26/2016   Zoster Recombinant(Shingrix ) 01/01/2021, 12/23/2021    Health Maintenance  Topic Date Due   HIV Screening  Never done   Hepatitis C Screening  Never done   INFLUENZA VACCINE  04/27/2024   COVID-19 Vaccine (6 - Pfizer risk 2024-25 season) 06/14/2024 (Originally 05/28/2024)   Hepatitis B  Vaccines 19-59 Average Risk (1 of 3 - 19+ 3-dose series) 04/05/2025 (Originally 06/01/1986)   Pneumococcal Vaccine: 50+ Years (1 of 1 - PCV) 05/25/2025 (Originally 06/01/2017)   Colonoscopy  03/16/2025   DTaP/Tdap/Td (5 - Td or Tdap) 02/25/2026   MAMMOGRAM  04/23/2026   Cervical Cancer Screening (HPV/Pap Cotest)  12/24/2026   Zoster Vaccines- Shingrix   Completed   HPV VACCINES  Aged Out   Meningococcal B Vaccine  Aged Out  Problem List Items Addressed This Visit       Active Problems   Vitamin D  deficiency (Chronic)   Stable with high dose weekly supplement.      Relevant Medications   Vitamin D , Ergocalciferol , (DRISDOL ) 1.25 MG (50000 UNIT) CAPS capsule   Essential hypertension, benign (Chronic)   Blood pressure is at goal for age and co-morbidities.   Recommendations: continue lisinopril - hctz 100-25 mg daily  - BP goal <130/80 - monitor and log blood pressures at home - check around the same time each day in a relaxed setting - Limit salt to <2000 mg/day - Follow DASH eating plan (heart healthy diet) - limit alcohol to 2 standard drinks per day for men and 1 per day for women - avoid tobacco products - get at least 2 hours of regular aerobic exercise weekly Patient aware of signs/symptoms requiring further/urgent evaluation.       Relevant Medications   losartan -hydrochlorothiazide (HYZAAR) 100-25 MG tablet   Adult ADHD   Wants to try to drop down to 60 mg dose. She will let me know cost/coverage if we need to adjust anything. F/u 3 months      Relevant Medications   lisdexamfetamine (VYVANSE ) 60 MG capsule   Other Visit Diagnoses       Annual physical exam    -  Primary     Labs completed last week. Stable.      PATIENT COUNSELING:    Recommend that most people either abstain from alcohol or drink within safe limits (<=14/week and <=4 drinks/occasion for males, <=7/weeks and <= 3 drinks/occasion for females) and that the risk for alcohol disorders  and other health effects rises proportionally with the number of drinks per week and how often a drinker exceeds daily limits.   Diet: Recommend to adjust caloric intake to maintain or achieve ideal body weight, to reduce intake of dietary saturated fat and total fat, to limit sodium intake by avoiding high sodium foods and not adding table salt, and to maintain adequate dietary potassium and calcium preferably from fresh fruits, vegetables, and low-fat dairy products.   Emphasized the importance of regular exercise.  Injury prevention: Recommend seatbelts, safety helmets, smoke detector, etc..   Dental health: Recommend regular tooth brushing, flossing, and dental visits.       Return in about 3 months (around 08/28/2024) for chronic disease management.     Waddell KATHEE Mon, NP

## 2024-05-29 NOTE — Assessment & Plan Note (Signed)
Blood pressure is at goal for age and co-morbidities.   Recommendations: continue lisinopril - hctz 100-25 mg daily - BP goal <130/80 - monitor and log blood pressures at home - check around the same time each day in a relaxed setting - Limit salt to <2000 mg/day - Follow DASH eating plan (heart healthy diet) - limit alcohol to 2 standard drinks per day for men and 1 per day for women - avoid tobacco products - get at least 2 hours of regular aerobic exercise weekly Patient aware of signs/symptoms requiring further/urgent evaluation.

## 2024-06-19 ENCOUNTER — Encounter: Payer: Self-pay | Admitting: Family Medicine

## 2024-06-19 DIAGNOSIS — F909 Attention-deficit hyperactivity disorder, unspecified type: Secondary | ICD-10-CM

## 2024-06-20 MED ORDER — LISDEXAMFETAMINE DIMESYLATE 60 MG PO CAPS: 60.0000 mg | ORAL_CAPSULE | Freq: Every day | ORAL | 0 refills | Status: DC

## 2024-06-20 MED ORDER — LISDEXAMFETAMINE DIMESYLATE 60 MG PO CAPS
60.0000 mg | ORAL_CAPSULE | Freq: Every day | ORAL | 0 refills | Status: DC
Start: 2024-06-27 — End: 2024-08-07

## 2024-07-25 ENCOUNTER — Telehealth: Payer: Self-pay | Admitting: Family Medicine

## 2024-07-25 NOTE — Telephone Encounter (Signed)
 Called and made patient aware no need for RX for vaccine, she expressed understanding.

## 2024-07-25 NOTE — Telephone Encounter (Unsigned)
 Copied from CRM [id number]. Topic: Clinical - Medication Question >> Jul 25, 2024 12:13 PM Suzen RAMAN wrote: Reason for CRM: patient would like a prescription for the  Covid Vaccine sent to  [id number] - [place], [place] - [address] [place] [address] Phone: [phone] Fax: [phone]

## 2024-08-03 ENCOUNTER — Ambulatory Visit: Admitting: Family Medicine

## 2024-08-07 ENCOUNTER — Encounter: Payer: Self-pay | Admitting: Family Medicine

## 2024-08-07 ENCOUNTER — Ambulatory Visit (INDEPENDENT_AMBULATORY_CARE_PROVIDER_SITE_OTHER): Admitting: Family Medicine

## 2024-08-07 ENCOUNTER — Ambulatory Visit: Admitting: Family Medicine

## 2024-08-07 VITALS — BP 124/76 | HR 80 | Ht 64.0 in | Wt 192.0 lb

## 2024-08-07 DIAGNOSIS — F909 Attention-deficit hyperactivity disorder, unspecified type: Secondary | ICD-10-CM | POA: Diagnosis not present

## 2024-08-07 DIAGNOSIS — Z780 Asymptomatic menopausal state: Secondary | ICD-10-CM | POA: Diagnosis not present

## 2024-08-07 DIAGNOSIS — I10 Essential (primary) hypertension: Secondary | ICD-10-CM

## 2024-08-07 DIAGNOSIS — Z23 Encounter for immunization: Secondary | ICD-10-CM

## 2024-08-07 DIAGNOSIS — E559 Vitamin D deficiency, unspecified: Secondary | ICD-10-CM

## 2024-08-07 MED ORDER — LISDEXAMFETAMINE DIMESYLATE 60 MG PO CAPS: 60.0000 mg | ORAL_CAPSULE | Freq: Every day | ORAL | 0 refills | Status: AC

## 2024-08-07 NOTE — Assessment & Plan Note (Signed)
 Continue vitamin D supplementation

## 2024-08-07 NOTE — Assessment & Plan Note (Signed)
Blood pressure is at goal for age and co-morbidities.   Recommendations: continue lisinopril - hctz 100-25 mg daily - BP goal <130/80 - monitor and log blood pressures at home - check around the same time each day in a relaxed setting - Limit salt to <2000 mg/day - Follow DASH eating plan (heart healthy diet) - limit alcohol to 2 standard drinks per day for men and 1 per day for women - avoid tobacco products - get at least 2 hours of regular aerobic exercise weekly Patient aware of signs/symptoms requiring further/urgent evaluation.

## 2024-08-07 NOTE — Assessment & Plan Note (Signed)
 Well-managed on Vyvanse  60 mg with no side effects. She is not ready to decrease the dose further. - Continue Vyvanse  60 mg daily. - Future dated next Vyvanse  refill. - PDMP reviewed.

## 2024-08-07 NOTE — Progress Notes (Signed)
 Established Patient Office Visit  Subjective   Patient ID: Janet Glover, female    DOB: [birth year]  Age: 58 y.o. MRN: [id number]  Chief Complaint  Patient presents with   Medical Management of Chronic Issues    HPI    Patient is here for routine 50-month follow-up. No new concerns.    ADHD/ADD: - Medications: Vyvanse  60 mg daily - Compliance good - Side effects: no - Contract: UTD - UDS: UTD   Hypertension: - Medications: losartan -hctz 100-25 mg daily - Compliance: good - Checking BP at home: occasionally, 120s/70s - Denies any SOB, recurrent headaches, CP, vision changes, LE edema, dizziness, palpitations, or medication side effects.   Obesity, prediabetes: - Medications: Mounjaro 12.5 mg weekly - prescribed elsewhere - Diet: heart healthy, portion control, cooking at home mostly - Exercise: regularly, slightly less due to busy schedule   Lab Results  Component Value Date   HGBA1C 5.6 05/25/2024    Wt Readings from Last 3 Encounters:  08/07/24 192 lb (87.1 kg)  05/29/24 193 lb (87.5 kg)  04/05/24 199 lb (90.3 kg)    Vitamin D  deficiency: - Supplementation: high dose weekly supplement. Stable at last check.   Menopausal symptoms: - Following with Kate Hickory at [facility] - Doing much better since starting estradiol patch, estrace cream, and progesterone  100 mg nightly     ROS All review of systems negative except what is listed in the HPI    Objective:     BP 124/76   Pulse 80   Ht 5' 4 (1.626 m)   Wt 192 lb (87.1 kg)   LMP 08/09/2018   SpO2 100%   BMI 32.96 kg/m    Physical Exam Vitals reviewed.  Constitutional:      Appearance: Normal appearance.  Cardiovascular:     Rate and Rhythm: Normal rate and regular rhythm.     Pulses: Normal pulses.     Heart sounds: Normal heart sounds.  Pulmonary:     Effort: Pulmonary effort is normal.     Breath sounds: Normal breath sounds.  Skin:    General: Skin is warm and dry.   Neurological:     Mental Status: She is alert and oriented to person, place, and time.  Psychiatric:        Mood and Affect: Mood normal.        Behavior: Behavior normal.        Thought Content: Thought content normal.        Judgment: Judgment normal.      No results found for any visits on 08/07/24.    The 10-year ASCVD risk score (Arnett DK, et al., 2019) is: 2.7%    Assessment & Plan:    Problem List Items Addressed This Visit       Active Problems   Vitamin D  deficiency (Chronic)   - Continue vitamin D  supplementation.      Essential hypertension, benign (Chronic)   Blood pressure is at goal for age and co-morbidities.   Recommendations: continue lisinopril - hctz 100-25 mg daily  - BP goal <130/80 - monitor and log blood pressures at home - check around the same time each day in a relaxed setting - Limit salt to <2000 mg/day - Follow DASH eating plan (heart healthy diet) - limit alcohol to 2 standard drinks per day for men and 1 per day for women - avoid tobacco products - get at least 2 hours of regular aerobic exercise weekly Patient aware of signs/symptoms  requiring further/urgent evaluation.       Adult ADHD - Primary   Well-managed on Vyvanse  60 mg with no side effects. She is not ready to decrease the dose further. - Continue Vyvanse  60 mg daily. - Future dated next Vyvanse  refill. - PDMP reviewed.       Relevant Medications   lisdexamfetamine (VYVANSE ) 60 MG capsule (Start on 11/02/2024)   lisdexamfetamine (VYVANSE ) 60 MG capsule (Start on 09/04/2024)   lisdexamfetamine (VYVANSE ) 60 MG capsule (Start on 10/03/2024)   Menopause   Doing well with hormone replacement. Following with GYN.       Other Visit Diagnoses       Flu vaccine need       Relevant Orders   Flu vaccine trivalent PF, 6mos and older(Flulaval ,Afluria,Fluarix,Fluzone) (Completed)            Return in about 3 months (around 11/07/2024) for chronic disease management.     Waddell KATHEE Mon, NP

## 2024-08-07 NOTE — Assessment & Plan Note (Signed)
 Doing well with hormone replacement. Following with GYN.

## 2024-10-08 ENCOUNTER — Ambulatory Visit: Admitting: Family Medicine
